# Patient Record
Sex: Male | Born: 1980 | Race: White | Hispanic: No | Smoking: Never smoker
Health system: Southern US, Community
[De-identification: ages and names within clinical notes are randomized; demographics above are authoritative.]

## PROBLEM LIST (undated history)

## (undated) DIAGNOSIS — F319 Bipolar disorder, unspecified: Secondary | ICD-10-CM

## (undated) HISTORY — PX: ADENOIDECTOMY: SUR15

## (undated) HISTORY — PX: CHOLECYSTECTOMY: SHX55

---

## 2015-09-30 ENCOUNTER — Encounter (HOSPITAL_BASED_OUTPATIENT_CLINIC_OR_DEPARTMENT_OTHER): Payer: Self-pay | Admitting: Emergency Medicine

## 2015-09-30 ENCOUNTER — Emergency Department (HOSPITAL_BASED_OUTPATIENT_CLINIC_OR_DEPARTMENT_OTHER): Payer: BLUE CROSS/BLUE SHIELD

## 2015-09-30 ENCOUNTER — Emergency Department (HOSPITAL_BASED_OUTPATIENT_CLINIC_OR_DEPARTMENT_OTHER)
Admission: EM | Admit: 2015-09-30 | Discharge: 2015-09-30 | Disposition: A | Discharge status: Home / Self Care | Payer: BLUE CROSS/BLUE SHIELD | Attending: Emergency Medicine | Admitting: Emergency Medicine

## 2015-09-30 DIAGNOSIS — R1013 Epigastric pain: Secondary | ICD-10-CM | POA: Diagnosis present

## 2015-09-30 DIAGNOSIS — K8020 Calculus of gallbladder without cholecystitis without obstruction: Secondary | ICD-10-CM | POA: Diagnosis not present

## 2015-09-30 LAB — CBC WITH DIFFERENTIAL/PLATELET
BASOS PCT: 0 %
Basophils Absolute: 0 10*3/uL (ref 0.0–0.1)
Eosinophils Absolute: 0.1 10*3/uL (ref 0.0–0.7)
Eosinophils Relative: 1 %
HCT: 43.1 % (ref 39.0–52.0)
HEMOGLOBIN: 15.4 g/dL (ref 13.0–17.0)
LYMPHS ABS: 1.2 10*3/uL (ref 0.7–4.0)
Lymphocytes Relative: 10 %
MCH: 29 pg (ref 26.0–34.0)
MCHC: 35.7 g/dL (ref 30.0–36.0)
MCV: 81.2 fL (ref 78.0–100.0)
MONOS PCT: 6 %
Monocytes Absolute: 0.8 10*3/uL (ref 0.1–1.0)
NEUTROS ABS: 10.7 10*3/uL — AB (ref 1.7–7.7)
NEUTROS PCT: 83 %
Platelets: 225 10*3/uL (ref 150–400)
RBC: 5.31 MIL/uL (ref 4.22–5.81)
RDW: 13.5 % (ref 11.5–15.5)
WBC: 12.8 10*3/uL — ABNORMAL HIGH (ref 4.0–10.5)

## 2015-09-30 LAB — COMPREHENSIVE METABOLIC PANEL
ALBUMIN: 4.5 g/dL (ref 3.5–5.0)
ALK PHOS: 38 U/L (ref 38–126)
ALT: 31 U/L (ref 17–63)
ANION GAP: 5 (ref 5–15)
AST: 27 U/L (ref 15–41)
BILIRUBIN TOTAL: 0.8 mg/dL (ref 0.3–1.2)
BUN: 15 mg/dL (ref 6–20)
CALCIUM: 9.2 mg/dL (ref 8.9–10.3)
CO2: 27 mmol/L (ref 22–32)
Chloride: 104 mmol/L (ref 101–111)
Creatinine, Ser: 1.01 mg/dL (ref 0.61–1.24)
GFR calc Af Amer: >60 mL/min (ref >60)
GFR calc non Af Amer: >60 mL/min (ref >60)
GLUCOSE: 113 mg/dL — AB (ref 65–99)
Potassium: 3.9 mmol/L (ref 3.5–5.1)
Sodium: 136 mmol/L (ref 135–145)
TOTAL PROTEIN: 7.6 g/dL (ref 6.5–8.1)

## 2015-09-30 LAB — LIPASE, BLOOD: Lipase: 19 U/L (ref 11–51)

## 2015-09-30 MED ORDER — PANTOPRAZOLE SODIUM 40 MG PO TBEC
40.0000 mg | DELAYED_RELEASE_TABLET | Freq: Once | ORAL | Status: AC
  Administered 2015-09-30: 40 mg via ORAL
  Filled 2015-09-30: qty 1

## 2015-09-30 MED ORDER — HYDROCODONE-ACETAMINOPHEN 5-325 MG PO TABS: 2.0000 | ORAL_TABLET | ORAL | Status: DC | PRN

## 2015-09-30 MED ORDER — ONDANSETRON HCL 4 MG/2ML IJ SOLN
4.0000 mg | Freq: Once | INTRAMUSCULAR | Status: AC
  Administered 2015-09-30: 4 mg via INTRAVENOUS
  Filled 2015-09-30: qty 2

## 2015-09-30 MED ORDER — ONDANSETRON 4 MG PO TBDP
4.0000 mg | ORAL_TABLET | Freq: Once | ORAL | Status: AC
  Administered 2015-09-30: 4 mg via ORAL
  Filled 2015-09-30: qty 1

## 2015-09-30 MED ORDER — ONDANSETRON 4 MG PO TBDP: 4.0000 mg | ORAL_TABLET | Freq: Three times a day (TID) | ORAL | Status: DC | PRN

## 2015-09-30 MED ORDER — GI COCKTAIL ~~LOC~~
30.0000 mL | Freq: Once | ORAL | Status: AC
  Administered 2015-09-30: 30 mL via ORAL
  Filled 2015-09-30: qty 30

## 2015-09-30 MED ORDER — IOPAMIDOL (ISOVUE-300) INJECTION 61%
100.0000 mL | Freq: Once | INTRAVENOUS | Status: AC | PRN
  Administered 2015-09-30: 100 mL via INTRAVENOUS

## 2015-09-30 MED ORDER — HYDROMORPHONE HCL 1 MG/ML IJ SOLN
1.0000 mg | Freq: Once | INTRAMUSCULAR | Status: AC
  Administered 2015-09-30: 1 mg via INTRAVENOUS
  Filled 2015-09-30: qty 1

## 2015-09-30 NOTE — ED Notes (Signed)
patient went to an urgent care this am for the same and was told to go home to increase his fiber. The patient states that since he has had an increase in the pain and he is now vomiting

## 2015-09-30 NOTE — ED Notes (Signed)
Pt transported to Radiology 

## 2015-09-30 NOTE — ED Provider Notes (Signed)
CSN: [id number]     Arrival date & time 09/30/15  1953 History  By signing my name below, I, Ronald Fritz, attest that this documentation has been prepared under the direction and in the presence of Rolland PorterMark Ceaser Ebeling, MD. Electronically Signed: Randell PatientMarrissa Fritz, ED Scribe. 09/30/2015. 10:39 PM.    Chief Complaint  Patient presents with  . Abdominal Pain   The history is provided by the patient. No language interpreter was used.  HPI Comments: Ronald Fritz is a 35 y.o. male who presents to the Emergency Department complaining of gradually worsening, dull/cramping epigastric abdominal pain that increased in frequency from intermittent to constant onset last night that woke him from sleep. Pt states that he ate a small meal last night but was asymptomatic until 20 hours ago when pain began. He notes that he was seen at an Urgent Care earlier today where he received abdominal imaging positive for a slight blockage, was diagnosed with constipation, and advised to increase his fiber intake. He reports emesis x2, intermittent nausea, loss of appetite upon waking this morning. He has drank fluids and eaten light meals today. His last normal BM was yesterday and he states that he has had a very small BM today. Pain is unchanged by bowel movements, vomiting, and palpation. He has taken Miralax and Metamucil without relief. Denies recent ETOH consumption, high caffeine intake, regular aspirin use. Denies similar symptoms in the past. Denies nausea currently, dysuria, urinary frequency, hematochezia, melena, or any other symptoms.  History reviewed. No pertinent past medical history. History reviewed. No pertinent past surgical history. History reviewed. No pertinent family history. Social History  Substance Use Topics  . Smoking status: Never Smoker   . Smokeless tobacco: None  . Alcohol Use: No    Review of Systems  Constitutional: Positive for appetite change. Negative for fever, chills, diaphoresis  and fatigue.  HENT: Negative for mouth sores, sore throat and trouble swallowing.   Eyes: Negative for visual disturbance.  Respiratory: Negative for cough, chest tightness, shortness of breath and wheezing.   Cardiovascular: Negative for chest pain.  Gastrointestinal: Positive for nausea, vomiting and abdominal pain. Negative for diarrhea, blood in stool and abdominal distention.  Endocrine: Negative for polydipsia, polyphagia and polyuria.  Genitourinary: Negative for dysuria, frequency and hematuria.  Musculoskeletal: Negative for gait problem.  Skin: Negative for color change, pallor and rash.  Neurological: Negative for dizziness, syncope, light-headedness and headaches.  Hematological: Does not bruise/bleed easily.  Psychiatric/Behavioral: Negative for behavioral problems and confusion.   Allergies  Review of patient's allergies indicates no known allergies.  Home Medications   Prior to Admission medications   Medication Sig Start Date End Date Taking? Authorizing Provider  HYDROcodone-acetaminophen (NORCO/VICODIN) 5-325 MG tablet Take 2 tablets by mouth every 4 (four) hours as needed. 09/30/15   Rolland PorterMark Anitria Andon, MD  ondansetron (ZOFRAN ODT) 4 MG disintegrating tablet Take 1 tablet (4 mg total) by mouth every 8 (eight) hours as needed for nausea. 09/30/15   Rolland PorterMark Quantrell Splitt, MD   BP 130/86 mmHg  Pulse 64  Temp(Src) 97.8 F (36.6 C) (Oral)  Resp 18  Ht 6\' 3"  (1.905 m)  Wt 290 lb (131.543 kg)  BMI 36.25 kg/m2  SpO2 97% Physical Exam  Constitutional: He is oriented to person, place, and time. He appears well-developed and well-nourished. No distress.  HENT:  Head: Normocephalic.  Eyes: Conjunctivae are normal. Pupils are equal, round, and reactive to light. No scleral icterus.  Neck: Normal range of motion. Neck supple. No thyromegaly  present.  Cardiovascular: Normal rate and regular rhythm.  Exam reveals no gallop and no friction rub.   No murmur heard. Pulmonary/Chest: Effort normal  and breath sounds normal. No respiratory distress. He has no wheezes. He has no rales.  Abdominal: Soft. He exhibits no distension. Bowel sounds are decreased. There is tenderness in the epigastric area. There is no rebound.  Tenderness over the mid-epigastrium. Diffuse hypoactive bowel sounds.  Musculoskeletal: Normal range of motion.  Neurological: He is alert and oriented to person, place, and time.  Skin: Skin is warm and dry. No rash noted.  Psychiatric: He has a normal mood and affect. His behavior is normal.    ED Course  Procedures   DIAGNOSTIC STUDIES: Oxygen Saturation is 100% on RA, normal by my interpretation.    COORDINATION OF CARE: 8:22 PM Will order GI cocktail, Zofran, Protonix, abdominal x-ray, and labs. Discussed treatment plan with pt at bedside and pt agreed to plan.  9:22 PM Ordered abdominal CT, IV fludis, and iopamidol.  Labs Review Labs Reviewed  CBC WITH DIFFERENTIAL/PLATELET - Abnormal; Notable for the following:    WBC 12.8 (*)    Neutro Abs 10.7 (*)    All other components within normal limits  COMPREHENSIVE METABOLIC PANEL - Abnormal; Notable for the following:    Glucose, Bld 113 (*)    All other components within normal limits  LIPASE, BLOOD    Imaging Review Ct Abdomen Pelvis W Contrast  09/30/2015  CLINICAL DATA:  Gradually worsening epigastric pain, worse last night when that awakened him from sleep. EXAM: CT ABDOMEN AND PELVIS WITH CONTRAST TECHNIQUE: Multidetector CT imaging of the abdomen and pelvis was performed using the standard protocol following bolus administration of intravenous contrast. CONTRAST:  ISOVUE-300 IOPAMIDOL (ISOVUE-300) INJECTION 61% COMPARISON:  Radiographs 09/30/2015 FINDINGS: There are at least 2 calculi within the gallbladder, measuring up to 2.5 cm. The gallbladder wall appears thickened and edematous. No bile duct dilatation. Liver is normal. The spleen, pancreas, adrenals and kidneys are normal. Ureters and  urinary bladder are unremarkable. Bowel is unremarkable. Appendix is normal. The abdominal aorta is normal in caliber. There is no atherosclerotic calcification. There is no adenopathy in the abdomen or pelvis. There is no significant abnormality in the lower chest. There is no significant musculoskeletal abnormality. IMPRESSION: Cholelithiasis with thickened gallbladder wall. This could represent acute cholecystitis. Consider ultrasound for better characterization. No other significant abnormality is evident in the abdomen or pelvis. Lung bases are clear. These results will be called to the ordering clinician or representative by the Radiologist Assistant, and communication documented in the PACS or zVision Dashboard. Electronically Signed   By: Ellery Plunk M.D.   On: 09/30/2015 22:31   Dg Abd Acute W/chest  09/30/2015  CLINICAL DATA:  35 year old male with a history of abdominal pain EXAM: DG ABDOMEN ACUTE W/ 1V CHEST COMPARISON:  None. FINDINGS: Chest: Cardiomediastinal silhouette within normal limits. No evidence of pulmonary vascular congestion. No pneumothorax. No pleural effusion. Coarsened interstitium. No confluent airspace disease. Abdomen: No unexpected calcifications.  No unexpected soft tissue density. No unexpected radiopaque foreign body. Gas within stomach, hepatic flexure, with minimal gas within small bowel loops. No displaced fracture. IMPRESSION: Chest: Nonspecific interstitial opacities bilaterally, potentially chronic, though could represent atypical infection. No evidence of lobar pneumonia. There is concern for further evaluation, a formal PA and lateral chest x-ray may be useful. Abdomen: Nonspecific bowel gas pattern. Signed, Yvone Neu. Loreta Ave, DO [facility] Radiology Electronically  Signed   By: Gilmer Mor D.O.   On: 09/30/2015 21:09   I have personally reviewed and evaluated these images and lab results as part of my medical  decision-making.   MDM   Final diagnoses:  Calculus of gallbladder without cholecystitis without obstruction   Initially, patient was hesitant for any pain medication as he states his wife was home asleep with the kids. I did discuss the CT scan findings with him. This does show some edema of the gallbladder wall and stones. He does not have elevation of papular enzymes. CBC 12.2. His pain. He does not have peritoneal irritation or Murphy sign of his right upper quadrant. I discussed with him that his stones are very likely the cause of his symptoms. We discussed fat-free diet liquids only. Symptom control. Return to ER with any new or worsening symptoms. Surgical referral for Monday morning. Has strong preference was to have sent control tonight and attempt outpatient follow-up. Ultimately states he would call for a ride. He was given some IV Dilaudid and Zofran.   Medical screening examination/treatment/procedure(s) were conducted as a shared visit with non-physician practitioner(s) and myself.  I personally evaluated the patient during the encounter.   EKG Interpretation None         Rolland Porter, MD 09/30/15 2245

## 2015-09-30 NOTE — Discharge Instructions (Signed)
Your CT scan shows gallstones, and some thickening of the gallbladder wall. Big meals and fatty foods will cause worsening pain from biliary colic/gallbladder. Clear liquids, fat-free diet for next 24 hours before slowly advancing your diet. Call [place] surgery on Monday for a follow-up appointment to discuss, or surgery. Return at any point emergency room if you vomiting, fever, or if your symptoms are not controlled with medications.      Cholelithiasis Cholelithiasis (also called gallstones) is a form of gallbladder disease in which gallstones form in your gallbladder. The gallbladder is an organ that stores bile made in the liver, which helps digest fats. Gallstones begin as small crystals and slowly grow into stones. Gallstone pain occurs when the gallbladder spasms and a gallstone is blocking the duct. Pain can also occur when a stone passes out of the duct.  RISK FACTORS  Being male.   Having multiple pregnancies. Health care providers sometimes advise removing diseased gallbladders before future pregnancies.   Being obese.  Eating a diet heavy in fried foods and fat.   Being older than 60 years and increasing age.   Prolonged use of medicines containing male hormones.   Having diabetes mellitus.   Rapidly losing weight.   Having a family history of gallstones (heredity).  SYMPTOMS  Nausea.   Vomiting.  Abdominal pain.   Yellowing of the skin (jaundice).   Sudden pain. It may persist from several minutes to several hours.  Fever.   Tenderness to the touch. In some cases, when gallstones do not move into the bile duct, people have no pain or symptoms. These are called "silent" gallstones.  TREATMENT Silent gallstones do not need treatment. In severe cases, emergency surgery may be required. Options for treatment include:  Surgery to remove the gallbladder. This is the most common treatment.  Medicines. These do not always work and  may take 6-12 months or more to work.  Shock wave treatment (extracorporeal biliary lithotripsy). In this treatment an ultrasound machine sends shock waves to the gallbladder to break gallstones into smaller pieces that can pass into the intestines or be dissolved by medicine. HOME CARE INSTRUCTIONS   Only take over-the-counter or prescription medicines for pain, discomfort, or fever as directed by your health care provider.   Follow a low-fat diet until seen again by your health care provider. Fat causes the gallbladder to contract, which can result in pain.   Follow up with your health care provider as directed. Attacks are almost always recurrent and surgery is usually required for permanent treatment.  SEEK IMMEDIATE MEDICAL CARE IF:   Your pain increases and is not controlled by medicines.   You have a fever or persistent symptoms for more than 2-3 days.   You have a fever and your symptoms suddenly get worse.   You have persistent nausea and vomiting.  MAKE SURE YOU:   Understand these instructions.  Will watch your condition.  Will get help right away if you are not doing well or get worse.   This information is not intended to replace advice given to you by your health care provider. Make sure you discuss any questions you have with your health care provider.   Document Released: 05/09/2005 Document Revised: 01/13/2013 Document Reviewed: 11/04/2012 Elsevier Interactive Patient Education [employer]2016 [employer] Inc.

## 2015-10-02 NOTE — ED Notes (Signed)
Spouse of patient called to say that the patient was seen here two days ago and has gallstones.  Can not get an appointment until 5/15.  States the patient got up from a nap and is pale in color and has chills.  Spouse told pt to take a shower.  Reviewed precautions with spouse for immediate return to the ed.  Fever, dizziness, uncontrolled nausea and vomiting.  Encouraged to call [facility] for guidance if feels the patient is getting worse. Verbalized understanding.

## 2015-10-03 ENCOUNTER — Observation Stay (HOSPITAL_COMMUNITY)
Admission: EM | Admit: 2015-10-03 | Discharge: 2015-10-05 | Disposition: A | Discharge status: Home / Self Care | Payer: BLUE CROSS/BLUE SHIELD | Attending: General Surgery | Admitting: General Surgery

## 2015-10-03 ENCOUNTER — Emergency Department (HOSPITAL_COMMUNITY): Payer: BLUE CROSS/BLUE SHIELD

## 2015-10-03 ENCOUNTER — Encounter (HOSPITAL_COMMUNITY): Payer: Self-pay

## 2015-10-03 DIAGNOSIS — K8000 Calculus of gallbladder with acute cholecystitis without obstruction: Principal | ICD-10-CM | POA: Insufficient documentation

## 2015-10-03 DIAGNOSIS — K810 Acute cholecystitis: Secondary | ICD-10-CM | POA: Diagnosis present

## 2015-10-03 DIAGNOSIS — R1011 Right upper quadrant pain: Secondary | ICD-10-CM

## 2015-10-03 DIAGNOSIS — Z6835 Body mass index (BMI) 35.0-35.9, adult: Secondary | ICD-10-CM | POA: Insufficient documentation

## 2015-10-03 LAB — LIPASE, BLOOD: LIPASE: 22 U/L (ref 11–51)

## 2015-10-03 LAB — COMPREHENSIVE METABOLIC PANEL
ALT: 79 U/L — AB (ref 17–63)
ANION GAP: 11 (ref 5–15)
AST: 79 U/L — ABNORMAL HIGH (ref 15–41)
Albumin: 3.3 g/dL — ABNORMAL LOW (ref 3.5–5.0)
Alkaline Phosphatase: 64 U/L (ref 38–126)
BUN: 10 mg/dL (ref 6–20)
CALCIUM: 8.7 mg/dL — AB (ref 8.9–10.3)
CHLORIDE: 99 mmol/L — AB (ref 101–111)
CO2: 26 mmol/L (ref 22–32)
CREATININE: 1 mg/dL (ref 0.61–1.24)
Glucose, Bld: 99 mg/dL (ref 65–99)
Potassium: 3.7 mmol/L (ref 3.5–5.1)
SODIUM: 136 mmol/L (ref 135–145)
Total Bilirubin: 1.2 mg/dL (ref 0.3–1.2)
Total Protein: 7.1 g/dL (ref 6.5–8.1)

## 2015-10-03 LAB — CBC WITH DIFFERENTIAL/PLATELET
Basophils Absolute: 0 10*3/uL (ref 0.0–0.1)
Basophils Relative: 0 %
EOS ABS: 0.2 10*3/uL (ref 0.0–0.7)
EOS PCT: 2 %
HCT: 40.1 % (ref 39.0–52.0)
Hemoglobin: 13.4 g/dL (ref 13.0–17.0)
LYMPHS ABS: 1.3 10*3/uL (ref 0.7–4.0)
LYMPHS PCT: 18 %
MCH: 28 pg (ref 26.0–34.0)
MCHC: 33.4 g/dL (ref 30.0–36.0)
MCV: 83.9 fL (ref 78.0–100.0)
MONO ABS: 0.6 10*3/uL (ref 0.1–1.0)
Monocytes Relative: 8 %
Neutro Abs: 5.4 10*3/uL (ref 1.7–7.7)
Neutrophils Relative %: 72 %
PLATELETS: 206 10*3/uL (ref 150–400)
RBC: 4.78 MIL/uL (ref 4.22–5.81)
RDW: 13.2 % (ref 11.5–15.5)
WBC: 7.4 10*3/uL (ref 4.0–10.5)

## 2015-10-03 MED ORDER — ONDANSETRON HCL 4 MG/2ML IJ SOLN
4.0000 mg | Freq: Four times a day (QID) | INTRAMUSCULAR | Status: DC | PRN
  Administered 2015-10-04 (×2): 4 mg via INTRAVENOUS
  Filled 2015-10-03 (×2): qty 2

## 2015-10-03 MED ORDER — ONDANSETRON 4 MG PO TBDP: 4.0000 mg | ORAL_TABLET | Freq: Four times a day (QID) | ORAL | Status: DC | PRN

## 2015-10-03 MED ORDER — ENOXAPARIN SODIUM 80 MG/0.8ML ~~LOC~~ SOLN
70.0000 mg | Freq: Every day | SUBCUTANEOUS | Status: DC
  Administered 2015-10-04: 70 mg via SUBCUTANEOUS
  Filled 2015-10-03: qty 0.8

## 2015-10-03 MED ORDER — ONDANSETRON HCL 4 MG/2ML IJ SOLN
4.0000 mg | Freq: Once | INTRAMUSCULAR | Status: AC
  Administered 2015-10-03: 4 mg via INTRAVENOUS
  Filled 2015-10-03: qty 2

## 2015-10-03 MED ORDER — ACETAMINOPHEN 650 MG RE SUPP: 650.0000 mg | Freq: Four times a day (QID) | RECTAL | Status: DC | PRN

## 2015-10-03 MED ORDER — HYDROMORPHONE HCL 1 MG/ML IJ SOLN
1.0000 mg | Freq: Once | INTRAMUSCULAR | Status: AC
  Administered 2015-10-03: 1 mg via INTRAVENOUS
  Filled 2015-10-03: qty 1

## 2015-10-03 MED ORDER — DEXTROSE 5 % IV SOLN
2.0000 g | INTRAVENOUS | Status: DC
  Administered 2015-10-03 – 2015-10-04 (×2): 2 g via INTRAVENOUS
  Filled 2015-10-03 (×3): qty 2

## 2015-10-03 MED ORDER — ACETAMINOPHEN 325 MG PO TABS: 650.0000 mg | ORAL_TABLET | Freq: Four times a day (QID) | ORAL | Status: DC | PRN

## 2015-10-03 MED ORDER — LACTATED RINGERS IV SOLN
INTRAVENOUS | Status: DC
  Administered 2015-10-03: 125 mL/h via INTRAVENOUS
  Administered 2015-10-04 – 2015-10-05 (×3): via INTRAVENOUS

## 2015-10-03 MED ORDER — HYDROMORPHONE HCL 1 MG/ML IJ SOLN
1.0000 mg | INTRAMUSCULAR | Status: DC | PRN
  Administered 2015-10-03 – 2015-10-05 (×9): 1 mg via INTRAVENOUS
  Filled 2015-10-03 (×10): qty 1

## 2015-10-03 NOTE — ED Provider Notes (Signed)
CSN: [id number]     Arrival date & time 10/03/15  1056 History   First MD Initiated Contact with Patient 10/03/15 1216     Chief Complaint  Patient presents with  . Abdominal Pain     (Consider location/radiation/quality/duration/timing/severity/associated sxs/prior Treatment) HPI Comments: Patient presents with 4 days of right upper quadrant abdominal pain that began in the epigastrium and has moved to the right upper quadrant. Pain does not radiate to the back or shoulder. Patient was seen at [facility] on 09/30/2015 and had a CT scan which demonstrated a thickened gallbladder wall with gallstones. Patient's pain was controlled and he was discharged home with Vicodin and surgery follow-up. Patient has been receiving some pain relief with Vicodin but the pain has been constant since that time. He has not developed any fevers, vomiting, or diarrhea. Patient has surgery follow-up on 5/15. Yesterday patient had an episode where he was pale with shivering chills. He took a shower which helped his symptoms. Onset of symptoms acute. Course is persistent. Nothing makes symptoms worse.  The history is provided by the patient and medical records.    History reviewed. No pertinent past medical history. History reviewed. No pertinent past surgical history. History reviewed. No pertinent family history. Social History  Substance Use Topics  . Smoking status: Never Smoker   . Smokeless tobacco: None  . Alcohol Use: No    Review of Systems  Constitutional: Negative for fever.  HENT: Negative for rhinorrhea and sore throat.   Eyes: Negative for redness.  Respiratory: Negative for cough.   Cardiovascular: Negative for chest pain.  Gastrointestinal: Positive for abdominal pain. Negative for nausea, vomiting and diarrhea.  Genitourinary: Negative for dysuria.  Musculoskeletal: Negative for myalgias.  Skin: Negative for rash.  Neurological: Negative for headaches.    Allergies  Review of patient's  allergies indicates no known allergies.  Home Medications   Prior to Admission medications   Medication Sig Start Date End Date Taking? Authorizing Provider  HYDROcodone-acetaminophen (NORCO/VICODIN) 5-325 MG tablet Take 2 tablets by mouth every 4 (four) hours as needed. 09/30/15   Rolland Porter, MD  ondansetron (ZOFRAN ODT) 4 MG disintegrating tablet Take 1 tablet (4 mg total) by mouth every 8 (eight) hours as needed for nausea. 09/30/15   Rolland Porter, MD   BP 126/76 mmHg  Pulse 86  Temp(Src) 98.7 F (37.1 C) (Oral)  Resp 18  Ht  (1.905 m)  Wt 129.275 kg  BMI 35.62 kg/m2  SpO2 97%   Physical Exam  Constitutional: He appears well-developed and well-nourished.  HENT:  Head: Normocephalic and atraumatic.  Eyes: Conjunctivae are normal. Right eye exhibits no discharge. Left eye exhibits no discharge.  Neck: Normal range of motion. Neck supple.  Cardiovascular: Normal rate, regular rhythm and normal heart sounds.   Pulmonary/Chest: Effort normal and breath sounds normal.  Abdominal: Soft. He exhibits no distension. Tenderness: moderate RUQ, neg Murphy's. There is no rebound and no guarding.  Neurological: He is alert.  Skin: Skin is warm and dry.  Psychiatric: He has a normal mood and affect.  Nursing note and vitals reviewed.   ED Course  Procedures (including critical care time) Labs Review Labs Reviewed  COMPREHENSIVE METABOLIC PANEL - Abnormal; Notable for the following:    Chloride 99 (*)    Calcium 8.7 (*)    Albumin 3.3 (*)    AST 79 (*)    ALT 79 (*)    All other components within normal limits  CBC WITH DIFFERENTIAL/PLATELET  LIPASE, BLOOD    Imaging Review [place] Abdomen Limited Ruq  10/03/2015  CLINICAL DATA:  Right upper abdominal pain. Cholelithiasis on recent CT EXAM: US ABDOMEN LIMITED - RIGHT UPPER QUADRANT COMPARISON:  CT 09/30/2015 FINDINGS: Gallbladder: Physiologically distended. Calculi measured up to 22 mm diameter. Wall thickening up to 2.8 mm, edematous.  Trace pericholecystic fluid. Common bile duct: Diameter: 5.4 mm Liver: No focal lesion identified. Within normal limits in parenchymal echogenicity. Trace perihepatic ascites. IMPRESSION: 1. Cholelithiasis with gallbladder wall thickening suggesting acute cholecystitis. If the clinical picture is inconclusive, hepatobiliary scintigraphy may be useful. Electronically Signed   By: Corlis Leak  Hassell M.D.   On: 10/03/2015 14:27   I have personally reviewed and evaluated these images and lab results as part of my medical decision-making.   12:37 PM Patient seen and examined. Work-up initiated. Medications ordered. Will order US to eval GB.   Vital signs reviewed and are as follows: BP 126/76 mmHg  Pulse 86  Temp(Src) 98.7 F (37.1 C) (Oral)  Resp 18  Ht 6\' 3"  (1.905 m)  Wt 129.275 kg  BMI 35.62 kg/m2  SpO2 97%  2:40 PM Pt's pain is controlled. US is suggestive of cholecystitis. Last oral intake was 9am (juice). Will consult surgery. Dr. Clayborne DanaMesner to see. Patient updated on results and plan.   2:54 PM Spoke with general surgery PA who will see patient. Anticipate admission. Pt agreeable.   MDM   Final diagnoses:  RUQ pain  Acute cholecystitis   Admit.    Renne CriglerJoshua Miriana Gaertner, PA-C 10/03/15 1455  Marily MemosJason Mesner, MD 10/04/15 1027

## 2015-10-03 NOTE — Progress Notes (Signed)
New Admission Note:   Arrival: From ED via wheelchair with EMT Mental Orientation: A&Ox4 Telemetry: None ordered Assessment:  See doc flowsheet Skin: Intact IV: Left AC IV, infusing Pain: RUQ pain, 4 out of 10 pain currently.  Patient did not want pain medication at this time. Safety Measures:  Call bell placed within reach; patient instructed on use of call bell and verbalized understanding. Bed in lowest position.   [address] Orientation: Patient oriented to staff, room, and unit. Family: None at bedside.   Orders have been reviewed and implemented. Consent signed and in chart.  Admit questions completed.  Patient educated on surgical protocol (i.e. Surgical PCR, CHG bath) and to call if he needs pain medication.  RN also informed patient that he is scheduled for OR at 0815.  Will continue to monitor.    Rozann Leschesebecca Nelia Rogoff, RN3, RN-BC, BSN

## 2015-10-03 NOTE — H&P (Signed)
Chief Complaint: abdominal pain HPI: Ronald Fritz is a healthy 35 year old male who presents with ongoing abdominal pain.  Started initially middle of the night on Friday night.  He was seen at an urgent care on Saturday and diagnosed with constipation.  He was seen at [facility] on Saturday night at which time LFTs wre normal, WBC was 12.8k, CT a/p with thickened gallbladder and cholelithiasis.  He was sent home with Norco, clear liquid diet and to follow up in our office which was scheduled for the 15th.  He ran out of norco this morning and had worsening RUQ abdominal pain.  Endorses to vomiting on Saturday, but none since.  Symptoms are exacerbated by hiccoughs, turning.  Somewhat alleviated by norco.  Time pattern is constant.  Severe in severity.  Reports chills.  Denies fevers, melena or hematochezia.  Abdominal US suggests wall thickening and stones.  LFTs and WBC are normal.  We have been asked to evaluate for ongoing pain.   History reviewed. No pertinent past medical history.  History reviewed. No pertinent past surgical history.  History reviewed. No pertinent family history. Social History:  reports that he has never smoked. He does not have any smokeless tobacco history on file. He reports that he does not drink alcohol or use illicit drugs.  Allergies: No Known Allergies   (Not in a hospital admission)  Results for orders placed or performed during the hospital encounter of 10/03/15 (from the past 48 hour(s))  CBC with Differential/Platelet     Status: None   Collection Time: 10/03/15 12:32 PM  Result Value Ref Range   WBC 7.4 4.0 - 10.5 K/uL   RBC 4.78 4.22 - 5.81 MIL/uL   Hemoglobin 13.4 13.0 - 17.0 g/dL   HCT 40.1 39.0 - 52.0 %   MCV 83.9 78.0 - 100.0 fL   MCH 28.0 26.0 - 34.0 pg   MCHC 33.4 30.0 - 36.0 g/dL   RDW 13.2 11.5 - 15.5 %   Platelets 206 150 - 400 K/uL   Neutrophils Relative % 72 %   Neutro Abs 5.4 1.7 - 7.7 K/uL   Lymphocytes Relative 18 %   Lymphs Abs 1.3  0.7 - 4.0 K/uL   Monocytes Relative 8 %   Monocytes Absolute 0.6 0.1 - 1.0 K/uL   Eosinophils Relative 2 %   Eosinophils Absolute 0.2 0.0 - 0.7 K/uL   Basophils Relative 0 %   Basophils Absolute 0.0 0.0 - 0.1 K/uL  Comprehensive metabolic panel     Status: Abnormal   Collection Time: 10/03/15 12:32 PM  Result Value Ref Range   Sodium 136 135 - 145 mmol/L   Potassium 3.7 3.5 - 5.1 mmol/L   Chloride 99 (L) 101 - 111 mmol/L   CO2 26 22 - 32 mmol/L   Glucose, Bld 99 65 - 99 mg/dL   BUN 10 6 - 20 mg/dL   Creatinine, Ser 1.00 0.61 - 1.24 mg/dL   Calcium 8.7 (L) 8.9 - 10.3 mg/dL   Total Protein 7.1 6.5 - 8.1 g/dL   Albumin 3.3 (L) 3.5 - 5.0 g/dL   AST 79 (H) 15 - 41 U/L   ALT 79 (H) 17 - 63 U/L   Alkaline Phosphatase 64 38 - 126 U/L   Total Bilirubin 1.2 0.3 - 1.2 mg/dL   GFR calc non Af Amer >60 >60 mL/min   GFR calc Af Amer >60 >60 mL/min    Comment: (NOTE) The eGFR has been calculated using the  CKD EPI equation. This calculation has not been validated in all clinical situations. eGFR's persistently <60 mL/min signify possible Chronic Kidney Disease.    Anion gap 11 5 - 15  Lipase, blood     Status: None   Collection Time: 10/03/15 12:32 PM  Result Value Ref Range   Lipase 22 11 - 51 U/L   US Abdomen Limited Ruq  10/03/2015  CLINICAL DATA:  Right upper abdominal pain. Cholelithiasis on recent CT EXAM: US ABDOMEN LIMITED - RIGHT UPPER QUADRANT COMPARISON:  CT 09/30/2015 FINDINGS: Gallbladder: Physiologically distended. Calculi measured up to 22 mm diameter. Wall thickening up to 2.8 mm, edematous. Trace pericholecystic fluid. Common bile duct: Diameter: 5.4 mm Liver: No focal lesion identified. Within normal limits in parenchymal echogenicity. Trace perihepatic ascites. IMPRESSION: 1. Cholelithiasis with gallbladder wall thickening suggesting acute cholecystitis. If the clinical picture is inconclusive, hepatobiliary scintigraphy may be useful. Electronically Signed   By: Lucrezia Europe  M.D.   On: 10/03/2015 14:27    Review of Systems  Constitutional: Positive for chills. Negative for fever, weight loss, malaise/fatigue and diaphoresis.  Eyes: Negative for blurred vision, double vision, photophobia, pain, discharge and redness.  Respiratory: Negative for cough, hemoptysis, sputum production, shortness of breath and wheezing.   Cardiovascular: Negative for chest pain, palpitations, orthopnea, claudication, leg swelling and PND.  Gastrointestinal: Positive for abdominal pain. Negative for heartburn, nausea, vomiting, diarrhea, constipation, blood in stool and melena.  Genitourinary: Negative for dysuria, urgency, frequency, hematuria and flank pain.  Musculoskeletal: Negative for myalgias, back pain, joint pain, falls and neck pain.  Neurological: Negative for dizziness, tingling, tremors, sensory change, speech change, focal weakness, seizures, loss of consciousness and weakness.  Psychiatric/Behavioral: Negative for depression, suicidal ideas, hallucinations, memory loss and substance abuse. The patient is not nervous/anxious and does not have insomnia.     Blood pressure 128/80, pulse 92, temperature 98.7 F (37.1 C), temperature source Oral, resp. rate 18, height _0  (1.905 m), weight 129.275 kg (285 lb), SpO2 97 %. Physical Exam  Constitutional: He is oriented to person, place, and time. He appears well-developed and well-nourished. No distress.  Cardiovascular: Normal rate, regular rhythm, normal heart sounds and intact distal pulses.  Exam reveals no gallop and no friction rub.   No murmur heard. Respiratory: Effort normal and breath sounds normal. No respiratory distress. He has no wheezes. He has no rales. He exhibits no tenderness.  GI: Soft. Bowel sounds are normal. He exhibits no distension and no mass. There is no rebound and no guarding.  +murphy's sign  Musculoskeletal: Normal range of motion. He exhibits no edema or tenderness.  Neurological: He is alert and  oriented to person, place, and time.  Skin: Skin is warm and dry. No rash noted. He is not diaphoretic. No erythema. No pallor.  Psychiatric: He has a normal mood and affect. His behavior is normal. Judgment and thought content normal.     Assessment/Plan Acute calculous cholecystitis-admit for IV antibiotics, lap chole in AM.  Surgical risks discussed including infection, bleeding, injury to surrounding structures, open surgery, need for further surgery, heart attack, DVT/PE, respiratory compromise and death.  He verbalizes understanding and wishes to proceed. ID-rocephin FEN-clears, NPO p MN, IVF, pain control VTE prophylaxis-SCD/lovenox Dispo-admit to floor   Matraca Hunkins, NP 10/03/2015, 3:22 PM

## 2015-10-03 NOTE — ED Notes (Signed)
Pt presents with continued abdominal pain.  [facility], diagnosed with gallstones, reports vicodin is not working.  Pt has surgical consult on 5/15.

## 2015-10-03 NOTE — ED Notes (Signed)
Clear liquid diet ordered.

## 2015-10-03 NOTE — ED Notes (Signed)
US called sts will be approximately 30 min. Primary RN updated.

## 2015-10-04 ENCOUNTER — Observation Stay (HOSPITAL_COMMUNITY): Payer: BLUE CROSS/BLUE SHIELD | Admitting: Critical Care Medicine

## 2015-10-04 ENCOUNTER — Encounter (HOSPITAL_COMMUNITY)
Admission: EM | Disposition: A | Discharge status: Home / Self Care | Payer: Self-pay | Source: Home / Self Care | Attending: Emergency Medicine

## 2015-10-04 ENCOUNTER — Encounter (HOSPITAL_COMMUNITY): Payer: Self-pay | Admitting: Critical Care Medicine

## 2015-10-04 HISTORY — PX: CHOLECYSTECTOMY: SHX55

## 2015-10-04 LAB — COMPREHENSIVE METABOLIC PANEL
ALT: 86 U/L — ABNORMAL HIGH (ref 17–63)
ANION GAP: 9 (ref 5–15)
AST: 55 U/L — ABNORMAL HIGH (ref 15–41)
Albumin: 2.8 g/dL — ABNORMAL LOW (ref 3.5–5.0)
Alkaline Phosphatase: 79 U/L (ref 38–126)
BUN: 9 mg/dL (ref 6–20)
CHLORIDE: 100 mmol/L — AB (ref 101–111)
CO2: 28 mmol/L (ref 22–32)
Calcium: 8.6 mg/dL — ABNORMAL LOW (ref 8.9–10.3)
Creatinine, Ser: 0.96 mg/dL (ref 0.61–1.24)
Glucose, Bld: 92 mg/dL (ref 65–99)
POTASSIUM: 3.9 mmol/L (ref 3.5–5.1)
SODIUM: 137 mmol/L (ref 135–145)
Total Bilirubin: 0.9 mg/dL (ref 0.3–1.2)
Total Protein: 5.8 g/dL — ABNORMAL LOW (ref 6.5–8.1)

## 2015-10-04 LAB — SURGICAL PCR SCREEN
MRSA, PCR: NEGATIVE
STAPHYLOCOCCUS AUREUS: NEGATIVE

## 2015-10-04 SURGERY — LAPAROSCOPIC CHOLECYSTECTOMY
Anesthesia: General | Site: Abdomen

## 2015-10-04 MED ORDER — FENTANYL CITRATE (PF) 250 MCG/5ML IJ SOLN
INTRAMUSCULAR | Status: AC
  Filled 2015-10-04: qty 5

## 2015-10-04 MED ORDER — GLYCOPYRROLATE 0.2 MG/ML IJ SOLN
INTRAMUSCULAR | Status: DC | PRN
  Administered 2015-10-04: .8 mg via INTRAVENOUS

## 2015-10-04 MED ORDER — LIDOCAINE HCL (CARDIAC) 20 MG/ML IV SOLN
INTRAVENOUS | Status: DC | PRN
  Administered 2015-10-04: 70 mg via INTRAVENOUS

## 2015-10-04 MED ORDER — HYDROMORPHONE HCL 1 MG/ML IJ SOLN
INTRAMUSCULAR | Status: AC
  Administered 2015-10-04: 1 mg via INTRAVENOUS
  Filled 2015-10-04: qty 1

## 2015-10-04 MED ORDER — FENTANYL CITRATE (PF) 100 MCG/2ML IJ SOLN
INTRAMUSCULAR | Status: DC | PRN
  Administered 2015-10-04 (×3): 50 ug via INTRAVENOUS
  Administered 2015-10-04: 150 ug via INTRAVENOUS

## 2015-10-04 MED ORDER — PROPOFOL 10 MG/ML IV BOLUS
INTRAVENOUS | Status: AC
  Filled 2015-10-04: qty 20

## 2015-10-04 MED ORDER — BUPIVACAINE HCL 0.25 % IJ SOLN
INTRAMUSCULAR | Status: DC | PRN
  Administered 2015-10-04: 5 mL

## 2015-10-04 MED ORDER — LACTATED RINGERS IV SOLN
INTRAVENOUS | Status: DC | PRN
  Administered 2015-10-04 (×2): via INTRAVENOUS

## 2015-10-04 MED ORDER — ONDANSETRON HCL 4 MG/2ML IJ SOLN
INTRAMUSCULAR | Status: AC
  Filled 2015-10-04: qty 2

## 2015-10-04 MED ORDER — ONDANSETRON HCL 4 MG/2ML IJ SOLN
INTRAMUSCULAR | Status: DC | PRN
  Administered 2015-10-04: 4 mg via INTRAVENOUS

## 2015-10-04 MED ORDER — MIDAZOLAM HCL 2 MG/2ML IJ SOLN
INTRAMUSCULAR | Status: AC
  Filled 2015-10-04: qty 2

## 2015-10-04 MED ORDER — BUPIVACAINE HCL (PF) 0.25 % IJ SOLN
INTRAMUSCULAR | Status: AC
  Filled 2015-10-04: qty 30

## 2015-10-04 MED ORDER — DEXAMETHASONE SODIUM PHOSPHATE 10 MG/ML IJ SOLN
INTRAMUSCULAR | Status: DC | PRN
  Administered 2015-10-04: 10 mg via INTRAVENOUS

## 2015-10-04 MED ORDER — HYDROMORPHONE HCL 1 MG/ML IJ SOLN
0.2500 mg | INTRAMUSCULAR | Status: DC | PRN
Start: 2015-10-04 — End: 2015-10-04
  Administered 2015-10-04: 0.5 mg via INTRAVENOUS

## 2015-10-04 MED ORDER — HEMOSTATIC AGENTS (NO CHARGE) OPTIME
TOPICAL | Status: DC | PRN
  Administered 2015-10-04: 1 via TOPICAL

## 2015-10-04 MED ORDER — ROCURONIUM BROMIDE 100 MG/10ML IV SOLN
INTRAVENOUS | Status: DC | PRN
  Administered 2015-10-04: 30 mg via INTRAVENOUS
  Administered 2015-10-04 (×4): 10 mg via INTRAVENOUS

## 2015-10-04 MED ORDER — MIDAZOLAM HCL 2 MG/2ML IJ SOLN: 0.5000 mg | Freq: Once | INTRAMUSCULAR | Status: DC | PRN

## 2015-10-04 MED ORDER — PROPOFOL 10 MG/ML IV BOLUS
INTRAVENOUS | Status: DC | PRN
  Administered 2015-10-04: 40 mg via INTRAVENOUS
  Administered 2015-10-04: 160 mg via INTRAVENOUS

## 2015-10-04 MED ORDER — SUCCINYLCHOLINE CHLORIDE 20 MG/ML IJ SOLN
INTRAMUSCULAR | Status: DC | PRN
  Administered 2015-10-04: 70 mg via INTRAVENOUS

## 2015-10-04 MED ORDER — MEPERIDINE HCL 25 MG/ML IJ SOLN: 6.2500 mg | INTRAMUSCULAR | Status: DC | PRN

## 2015-10-04 MED ORDER — DEXAMETHASONE SODIUM PHOSPHATE 10 MG/ML IJ SOLN
INTRAMUSCULAR | Status: AC
  Filled 2015-10-04: qty 1

## 2015-10-04 MED ORDER — SODIUM CHLORIDE 0.9 % IR SOLN
Status: DC | PRN
  Administered 2015-10-04 (×3): 1

## 2015-10-04 MED ORDER — MIDAZOLAM HCL 5 MG/5ML IJ SOLN
INTRAMUSCULAR | Status: DC | PRN
  Administered 2015-10-04: 2 mg via INTRAVENOUS

## 2015-10-04 MED ORDER — OXYCODONE-ACETAMINOPHEN 5-325 MG PO TABS
1.0000 | ORAL_TABLET | ORAL | Status: DC | PRN
  Administered 2015-10-05 (×2): 2 via ORAL
  Filled 2015-10-04 (×2): qty 2

## 2015-10-04 MED ORDER — NEOSTIGMINE METHYLSULFATE 10 MG/10ML IV SOLN
INTRAVENOUS | Status: DC | PRN
  Administered 2015-10-04: 5 mg via INTRAVENOUS

## 2015-10-04 MED ORDER — 0.9 % SODIUM CHLORIDE (POUR BTL) OPTIME
TOPICAL | Status: DC | PRN
  Administered 2015-10-04: 1000 mL

## 2015-10-04 SURGICAL SUPPLY — 41 items
BENZOIN TINCTURE PRP APPL 2/3 (GAUZE/BANDAGES/DRESSINGS) ×3 IMPLANT
CANISTER SUCTION 2500CC (MISCELLANEOUS) ×3 IMPLANT
CHLORAPREP W/TINT 26ML (MISCELLANEOUS) ×3 IMPLANT
CLIP LIGATING HEMO O LOK GREEN (MISCELLANEOUS) ×3 IMPLANT
CLOSURE WOUND 1/2 X4 (GAUZE/BANDAGES/DRESSINGS) ×1
COVER SURGICAL LIGHT HANDLE (MISCELLANEOUS) ×3 IMPLANT
COVER TRANSDUCER ULTRASND (DRAPES) IMPLANT
DEVICE TROCAR PUNCTURE CLOSURE (ENDOMECHANICALS) ×3 IMPLANT
ELECT REM PT RETURN 9FT ADLT (ELECTROSURGICAL) ×3
ELECTRODE REM PT RTRN 9FT ADLT (ELECTROSURGICAL) ×1 IMPLANT
GAUZE SPONGE 2X2 8PLY STRL LF (GAUZE/BANDAGES/DRESSINGS) ×1 IMPLANT
GLOVE BIO SURGEON STRL SZ 6.5 (GLOVE) ×2 IMPLANT
GLOVE BIO SURGEON STRL SZ7.5 (GLOVE) ×6 IMPLANT
GLOVE BIO SURGEONS STRL SZ 6.5 (GLOVE) ×1
GLOVE BIOGEL PI IND STRL 7.0 (GLOVE) ×1 IMPLANT
GLOVE BIOGEL PI INDICATOR 7.0 (GLOVE) ×2
GOWN STRL REUS W/ TWL LRG LVL3 (GOWN DISPOSABLE) ×2 IMPLANT
GOWN STRL REUS W/ TWL XL LVL3 (GOWN DISPOSABLE) ×1 IMPLANT
GOWN STRL REUS W/TWL LRG LVL3 (GOWN DISPOSABLE) ×4
GOWN STRL REUS W/TWL XL LVL3 (GOWN DISPOSABLE) ×2
HEMOSTAT SNOW SURGICEL 2X4 (HEMOSTASIS) ×3 IMPLANT
KIT BASIN OR (CUSTOM PROCEDURE TRAY) ×3 IMPLANT
KIT ROOM TURNOVER OR (KITS) ×3 IMPLANT
NEEDLE INSUFFLATION 14GA 120MM (NEEDLE) ×3 IMPLANT
NS IRRIG 1000ML POUR BTL (IV SOLUTION) ×3 IMPLANT
PAD ARMBOARD 7.5X6 YLW CONV (MISCELLANEOUS) ×6 IMPLANT
POUCH RETRIEVAL ECOSAC 10 (ENDOMECHANICALS) IMPLANT
POUCH RETRIEVAL ECOSAC 10MM (ENDOMECHANICALS)
SCISSORS LAP 5X35 DISP (ENDOMECHANICALS) ×3 IMPLANT
SET IRRIG TUBING LAPAROSCOPIC (IRRIGATION / IRRIGATOR) ×3 IMPLANT
SLEEVE ENDOPATH XCEL 5M (ENDOMECHANICALS) ×6 IMPLANT
SPECIMEN JAR SMALL (MISCELLANEOUS) ×3 IMPLANT
SPONGE GAUZE 2X2 STER 10/PKG (GAUZE/BANDAGES/DRESSINGS) ×2
STRIP CLOSURE SKIN 1/2X4 (GAUZE/BANDAGES/DRESSINGS) ×2 IMPLANT
SUT MNCRL AB 3-0 PS2 18 (SUTURE) ×3 IMPLANT
TOWEL OR 17X24 6PK STRL BLUE (TOWEL DISPOSABLE) ×3 IMPLANT
TOWEL OR 17X26 10 PK STRL BLUE (TOWEL DISPOSABLE) ×3 IMPLANT
TRAY LAPAROSCOPIC MC (CUSTOM PROCEDURE TRAY) ×3 IMPLANT
TROCAR XCEL NON-BLD 11X100MML (ENDOMECHANICALS) ×3 IMPLANT
TROCAR XCEL NON-BLD 5MMX100MML (ENDOMECHANICALS) ×3 IMPLANT
TUBING INSUFFLATION (TUBING) ×3 IMPLANT

## 2015-10-04 NOTE — Anesthesia Procedure Notes (Signed)
Procedure Name: Intubation Date/Time: 10/04/2015 8:45 AM Performed by: Glo HerringLEE, Noriah Osgood B Pre-anesthesia Checklist: Patient identified, Emergency Drugs available, Timeout performed, Suction available and Patient being monitored Patient Re-evaluated:Patient Re-evaluated prior to inductionOxygen Delivery Method: Circle system utilized Preoxygenation: Pre-oxygenation with 100% oxygen Intubation Type: IV induction, Rapid sequence and Cricoid Pressure applied Laryngoscope Size: Mac and 4 Grade View: Grade I Tube type: Oral Tube size: 7.5 mm Number of attempts: 1 Airway Equipment and Method: Stylet Placement Confirmation: CO2 detector,  positive ETCO2,  breath sounds checked- equal and bilateral and ETT inserted through vocal cords under direct vision Secured at: 23 cm Tube secured with: Tape Dental Injury: Teeth and Oropharynx as per pre-operative assessment

## 2015-10-04 NOTE — Op Note (Signed)
10/04/2015  10:51 AM  PATIENT:  Ronald Fritz  35 y.o. male  PRE-OPERATIVE DIAGNOSIS:  ACUTE CALCULUS CHOLECYSTITIS  POST-OPERATIVE DIAGNOSIS:  ACUTE CALCULUS CHOLECYSTITIS  PROCEDURE:  Procedure(s): LAPAROSCOPIC CHOLECYSTECTOMY (N/A)  SURGEON:  Surgeon(s) and Role:    * Axel FillerArmando Chaniece Barbato, MD - Primary  ASSISTANTS: Dr. Madelin RearJosh Rickey   ANESTHESIA:   local and general  EBL:  Total I/O In: 1200 [I.V.:1200] Out: 150 [Blood:150]  BLOOD ADMINISTERED:none  DRAINS: none   LOCAL MEDICATIONS USED:  BUPIVICAINE   SPECIMEN:  Source of Specimen:  gallbladder  DISPOSITION OF SPECIMEN:  PATHOLOGY  COUNTS:  YES  TOURNIQUET:  * No tourniquets in log *  DICTATION: .Dragon Dictation The patient was taken to the operating and placed in the supine position with bilateral SCDs in place. The patient was prepped and draped in the usual sterile fashion. A time out was called and all facts were verified. A pneumoperitoneum was obtained via A Veress needle technique to a pressure of 14mm of mercury.  A 5mm trochar was then placed in the right upper quadrant under visualization, and there were no injuries to any abdominal organs. A 11 mm port was then placed in the umbilical region after infiltrating with local anesthesia under direct visualization. A second and third epigastric port and right lower quadrant port placement under direct visualization, respectively.   The gallbladder was identified and retracted, the peritoneum was then sharply dissected from the gallbladder and this dissection was carried down to Calot's triangle. There were dense adhesions at Calot's triangle. The gallbladder was identified and stripped away circumferentially and seen going into the gallbladder 360, the critical angle was obtained.  2 clips were placed proximally one distally and the cystic duct transected. The cystic artery's anterior and posterior branches were identified individually and 2 clips placed proximally and  one distally and transected. We then proceeded to remove the gallbladder off the hepatic fossa with Bovie cautery. A retrieval bag was then placed in the abdomen and gallbladder placed in the bag. The hepatic fossa was then reexamined and hemostasis was achieved and Surgicel snow was placed in the gallbladder fossa. The subhepatic fossa and perihepatic fossa was then irrigated until the effluent was clear.  The gallbladder and bag were removed from the abdominal cavity. The 11 mm trocar fascia was reapproximated with the Endo Close #1 Vicryl x4. The pneumoperitoneum was evacuated and all trochars removed under direct visulalization. The skin was then closed with 4-0 Monocryl and the skin dressed with Steri-Strips, gauze, and tape. The patient was awaken from general anesthesia and taken to the recovery room in stable condition.   PLAN OF CARE: Admit to inpatient   PATIENT DISPOSITION:  PACU - hemodynamically stable.   Delay start of Pharmacological VTE agent (>24hrs) due to surgical blood loss or risk of bleeding: not applicable

## 2015-10-04 NOTE — Transfer of Care (Signed)
Immediate Anesthesia Transfer of Care Note  Patient: Ria BushDavid Surman  Procedure(s) Performed: Procedure(s): LAPAROSCOPIC CHOLECYSTECTOMY (N/A)  Patient Location: PACU  Anesthesia Type:General  Level of Consciousness: awake, alert  and oriented  Airway & Oxygen Therapy: Patient Spontanous Breathing and Patient connected to nasal cannula oxygen  Post-op Assessment: Report given to RN, Post -op Vital signs reviewed and stable and Patient moving all extremities X 4  Post vital signs: Reviewed and stable  Last Vitals:  Filed Vitals:   10/04/15 0000 10/04/15 0405  BP: 120/78 123/80  Pulse: 59 73  Temp: 37.3 C 36.9 C  Resp: 22 20    Last Pain:  Filed Vitals:   10/04/15 0649  PainSc: 2       Patients Stated Pain Goal: 0 (10/04/15 0534)  Complications: No apparent anesthesia complications

## 2015-10-04 NOTE — Anesthesia Postprocedure Evaluation (Signed)
Anesthesia Post Note  Patient: Ronald BushDavid Fritz  Procedure(s) Performed: Procedure(s) (LRB): LAPAROSCOPIC CHOLECYSTECTOMY (N/A)  Patient location during evaluation: PACU Anesthesia Type: General Level of consciousness: awake and alert, oriented and patient cooperative Pain management: pain level controlled Vital Signs Assessment: post-procedure vital signs reviewed and stable Respiratory status: spontaneous breathing, nonlabored ventilation and respiratory function stable Cardiovascular status: blood pressure returned to baseline and stable Postop Assessment: no signs of nausea or vomiting Anesthetic complications: no    Last Vitals:  Filed Vitals:   10/04/15 1130 10/04/15 1152  BP: 152/82 148/86  Pulse:  74  Temp: 36.5 C 36.8 C  Resp: 18 18    Last Pain:  Filed Vitals:   10/04/15 1153  PainSc: 0-No pain                 [name],E. Maelie Chriswell

## 2015-10-04 NOTE — Anesthesia Preprocedure Evaluation (Addendum)
Anesthesia Evaluation  Patient identified by MRN, date of birth, ID band Patient awake    Reviewed: Allergy & Precautions, NPO status , Patient's Chart, lab work & pertinent test results  History of Anesthesia Complications Negative for: history of anesthetic complications  Airway Mallampati: II  TM Distance: >3 FB Neck ROM: Full    Dental  (+) Dental Advisory Given, Chipped   Pulmonary neg pulmonary ROS,    breath sounds clear to auscultation       Cardiovascular negative cardio ROS   Rhythm:Regular Rate:Normal     Neuro/Psych negative neurological ROS     GI/Hepatic Neg liver ROS, abd pain with chole   Endo/Other  Morbid obesity  Renal/GU negative Renal ROS     Musculoskeletal   Abdominal (+) + obese,   Peds  Hematology negative hematology ROS (+)   Anesthesia Other Findings   Reproductive/Obstetrics                            Anesthesia Physical Anesthesia Plan  ASA: I  Anesthesia Plan: General   Post-op Pain Management:    Induction: Intravenous  Airway Management Planned: Oral ETT  Additional Equipment:   Intra-op Plan:   Post-operative Plan: Extubation in OR  Informed Consent: I have reviewed the patients History and Physical, chart, labs and discussed the procedure including the risks, benefits and alternatives for the proposed anesthesia with the patient or authorized representative who has indicated his/her understanding and acceptance.   Dental advisory given  Plan Discussed with: Anesthesiologist, Surgeon and CRNA  Anesthesia Plan Comments: (Plan routine monitors, GETA)       Anesthesia Quick Evaluation

## 2015-10-05 ENCOUNTER — Encounter (HOSPITAL_COMMUNITY): Payer: Self-pay | Admitting: General Surgery

## 2015-10-05 MED ORDER — OXYCODONE-ACETAMINOPHEN 5-325 MG PO TABS: 1.0000 | ORAL_TABLET | ORAL | Status: DC | PRN

## 2015-10-05 NOTE — Progress Notes (Signed)
[facility]  OK to discharge to home.

## 2015-10-05 NOTE — Discharge Summary (Signed)
  Physician Discharge Summary  Patient ID: Ronald BushDavid Fritz MRN: [id number] DOB/AGE: 1980/12/16 35 y.o.  Admit date: 10/03/2015 Discharge date: 10/05/2015  Admitting Diagnosis: Acute calculous cholecystitis   Discharge Diagnosis Patient Active Problem List   Diagnosis Date Noted  . Acute cholecystitis 10/03/2015    Consultants none  Imaging: Koreas Abdomen Limited Ruq  10/03/2015  CLINICAL DATA:  Right upper abdominal pain. Cholelithiasis on recent CT EXAM: US ABDOMEN LIMITED - RIGHT UPPER QUADRANT COMPARISON:  CT 09/30/2015 FINDINGS: Gallbladder: Physiologically distended. Calculi measured up to 22 mm diameter. Wall thickening up to 2.8 mm, edematous. Trace pericholecystic fluid. Common bile duct: Diameter: 5.4 mm Liver: No focal lesion identified. Within normal limits in parenchymal echogenicity. Trace perihepatic ascites. IMPRESSION: 1. Cholelithiasis with gallbladder wall thickening suggesting acute cholecystitis. If the clinical picture is inconclusive, hepatobiliary scintigraphy may be useful. Electronically Signed   By: Corlis Leak  Hassell M.D.   On: 10/03/2015 14:27    Procedures Laparoscopic cholecystectomy Dr. Derrell Lollingamirez 10/04/15  HPI: Ronald Fritz is a healthy 35 year old male who presents with ongoing abdominal pain. Started initially middle of the night on Friday night. He was seen at an urgent care on Saturday and diagnosed with constipation. [facility] night at which time LFTs wre normal, WBC was 12.8k, CT a/p with thickened gallbladder and cholelithiasis. He was sent home with Norco, clear liquid diet and to follow up in our office which was scheduled for the 15th. He ran out of norco this morning and had worsening RUQ abdominal pain. Endorses to vomiting on Saturday, but none since. Symptoms are exacerbated by hiccoughs, turning. Somewhat alleviated by norco. Time pattern is constant. Severe in severity. Reports chills. Denies fevers, melena or hematochezia.  Abdominal US suggests wall thickening and stones. LFTs and WBC are normal. We have been asked to evaluate for ongoing pain.    Hospital Course:  Patient was admitted and underwent procedure listed above.  Tolerated procedure well and was transferred to the floor.  Diet was advanced as tolerated.  On POD#1, the patient was voiding well, tolerating diet, ambulating well, pain well controlled, vital signs stable, incisions c/d/i and felt stable for discharge home.  Medication risks, benefits and therapeutic alternatives were reviewed with the patient.  He verbalizes understanding.  Patient will follow up in our office in 3 weeks and knows to call with questions or concerns.  Physical Exam: General:  Alert, NAD, pleasant, comfortable Abd:  Soft, ND, mild tenderness, incisions C/D/I    Medication List    STOP taking these medications        HYDROcodone-acetaminophen 5-325 MG tablet  Commonly known as:  NORCO/VICODIN     ondansetron 4 MG disintegrating tablet  Commonly known as:  ZOFRAN ODT      TAKE these medications        oxyCODONE-acetaminophen 5-325 MG tablet  Commonly known as:  PERCOCET/ROXICET  Take 1-2 tablets by mouth every 4 (four) hours as needed for moderate pain or severe pain.             Follow-up Information    Follow up with [facility] On 10/25/2015.   Specialty:  General Surgery   Why:  arrive by 9:15AM for a 9:45AM post op check   Contact information:   [address] [place] [address] [phone]       Signed: Ashok Norrismina Raye Slyter, [facility]BC [facility] [phone]  10/05/2015, 8:25 AM

## 2015-10-05 NOTE — Progress Notes (Signed)
Per patient surgeon came in and examined his leaking incision and told him this was OK and gave him some gauze to change at home when saturated.   Discharge instructions and medications discussed with patient.  Prescription given to patient.  All questions answered.

## 2015-10-05 NOTE — Discharge Instructions (Signed)

## 2015-10-05 NOTE — Progress Notes (Signed)
Dressing over dressing #1 has been changed 3 times d/t saturation.  Patient concerned because he is discharged home and incision is pooling out fluid.  Emina Riebock with [facility] paged.

## 2016-11-11 ENCOUNTER — Emergency Department (HOSPITAL_COMMUNITY)
Admission: EM | Admit: 2016-11-11 | Discharge: 2016-11-12 | Disposition: A | Discharge status: Home / Self Care | Payer: BLUE CROSS/BLUE SHIELD | Attending: Emergency Medicine | Admitting: Emergency Medicine

## 2016-11-11 ENCOUNTER — Encounter (HOSPITAL_COMMUNITY): Payer: Self-pay

## 2016-11-11 DIAGNOSIS — R45851 Suicidal ideations: Secondary | ICD-10-CM | POA: Diagnosis not present

## 2016-11-11 DIAGNOSIS — R451 Restlessness and agitation: Secondary | ICD-10-CM | POA: Diagnosis present

## 2016-11-11 DIAGNOSIS — F3164 Bipolar disorder, current episode mixed, severe, with psychotic features: Secondary | ICD-10-CM | POA: Diagnosis present

## 2016-11-11 DIAGNOSIS — F3131 Bipolar disorder, current episode depressed, mild: Secondary | ICD-10-CM | POA: Insufficient documentation

## 2016-11-11 DIAGNOSIS — Z5181 Encounter for therapeutic drug level monitoring: Secondary | ICD-10-CM | POA: Insufficient documentation

## 2016-11-11 LAB — ACETAMINOPHEN LEVEL

## 2016-11-11 LAB — COMPREHENSIVE METABOLIC PANEL
ALBUMIN: 4.3 g/dL (ref 3.5–5.0)
ALT: 35 U/L (ref 17–63)
ANION GAP: 10 (ref 5–15)
AST: 48 U/L — AB (ref 15–41)
Alkaline Phosphatase: 49 U/L (ref 38–126)
BUN: 15 mg/dL (ref 6–20)
CHLORIDE: 101 mmol/L (ref 101–111)
CO2: 22 mmol/L (ref 22–32)
Calcium: 8.8 mg/dL — ABNORMAL LOW (ref 8.9–10.3)
Creatinine, Ser: 1.3 mg/dL — ABNORMAL HIGH (ref 0.61–1.24)
GFR calc Af Amer: >60 mL/min (ref >60)
GLUCOSE: 97 mg/dL (ref 65–99)
POTASSIUM: 3.6 mmol/L (ref 3.5–5.1)
Sodium: 133 mmol/L — ABNORMAL LOW (ref 135–145)
Total Bilirubin: 0.5 mg/dL (ref 0.3–1.2)
Total Protein: 7.2 g/dL (ref 6.5–8.1)

## 2016-11-11 LAB — RAPID URINE DRUG SCREEN, HOSP PERFORMED
AMPHETAMINES: NOT DETECTED
BENZODIAZEPINES: NOT DETECTED
Barbiturates: NOT DETECTED
COCAINE: NOT DETECTED
OPIATES: NOT DETECTED
Tetrahydrocannabinol: POSITIVE — AB

## 2016-11-11 LAB — CBC
HCT: 40.3 % (ref 39.0–52.0)
Hemoglobin: 14.5 g/dL (ref 13.0–17.0)
MCH: 29 pg (ref 26.0–34.0)
MCHC: 36 g/dL (ref 30.0–36.0)
MCV: 80.6 fL (ref 78.0–100.0)
PLATELETS: 218 10*3/uL (ref 150–400)
RBC: 5 MIL/uL (ref 4.22–5.81)
RDW: 12.7 % (ref 11.5–15.5)
WBC: 10.1 10*3/uL (ref 4.0–10.5)

## 2016-11-11 LAB — ETHANOL

## 2016-11-11 LAB — SALICYLATE LEVEL: Salicylate Lvl: <7 mg/dL (ref 2.8–30.0)

## 2016-11-11 LAB — LITHIUM LEVEL: LITHIUM LVL: 0.15 mmol/L — AB (ref 0.60–1.20)

## 2016-11-11 MED ORDER — ALUM & MAG HYDROXIDE-SIMETH 200-200-20 MG/5ML PO SUSP: 30.0000 mL | Freq: Four times a day (QID) | ORAL | Status: DC | PRN

## 2016-11-11 MED ORDER — TRAZODONE HCL 50 MG PO TABS
50.0000 mg | ORAL_TABLET | Freq: Every day | ORAL | Status: DC
  Administered 2016-11-12: 50 mg via ORAL
  Filled 2016-11-11: qty 1

## 2016-11-11 MED ORDER — LITHIUM CARBONATE ER 450 MG PO TBCR
450.0000 mg | EXTENDED_RELEASE_TABLET | Freq: Two times a day (BID) | ORAL | Status: DC
  Administered 2016-11-12 (×2): 450 mg via ORAL
  Filled 2016-11-11 (×2): qty 1

## 2016-11-11 MED ORDER — IBUPROFEN 200 MG PO TABS: 600.0000 mg | ORAL_TABLET | Freq: Three times a day (TID) | ORAL | Status: DC | PRN

## 2016-11-11 MED ORDER — ONDANSETRON HCL 4 MG PO TABS: 4.0000 mg | ORAL_TABLET | Freq: Three times a day (TID) | ORAL | Status: DC | PRN

## 2016-11-11 MED ORDER — ACETAMINOPHEN 325 MG PO TABS: 650.0000 mg | ORAL_TABLET | ORAL | Status: DC | PRN
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
## 2016-11-11 NOTE — ED Notes (Signed)
Bed: [id number] Expected date:  Expected time:  Means of arrival:  Comments: [employer]  Voluntary male
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
## 2016-11-12 ENCOUNTER — Other Ambulatory Visit (HOSPITAL_COMMUNITY): Payer: BLUE CROSS/BLUE SHIELD | Attending: Psychiatry | Admitting: Professional

## 2016-11-12 DIAGNOSIS — F3131 Bipolar disorder, current episode depressed, mild: Secondary | ICD-10-CM

## 2016-11-12 DIAGNOSIS — F3164 Bipolar disorder, current episode mixed, severe, with psychotic features: Secondary | ICD-10-CM | POA: Diagnosis present

## 2016-11-12 NOTE — ED Notes (Signed)
[facility] Expected date:  Expected time:  Means of arrival:  Comments: 2522
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
## 2016-11-12 NOTE — ED Notes (Signed)
Pt wanded and placed in room 40.  Pt cooperative, calm and friendly and polite.  Pt sts he just wants to sleep.  Pt currently denies SI, HI and AVH.  Pt verbally contracts for safety.  Pt denies pain at this time.  Pt sts he is hungry and asks for snacks. Pt given snacks and prn meds. Pt remains safe on unit

## 2016-11-12 NOTE — ED Notes (Signed)
Pt discharged home. Discharged instructions read to pt who verbalized understanding. All belongings returned to pt who signed for same. Denies SI/HI, is not delusional and not responding to internal stimuli. Escorted pt to the ED exit.    

## 2016-11-12 NOTE — BH Assessment (Signed)
[facility] Assessment Progress Note  Per Thedore MinsMojeed Akintayo, MD, this pt does not require psychiatric hospitalization at this time.  Pt presents under IVC initiated by his spouse, which Dr Jannifer FranklinAkintayo has rescinded.  Pt is to be referred back to the [facility], his current outpatient provider.  This has been included in pt's discharge instructions.  Pt's nurse, Diane, has been notified.  Doylene Canninghomas Pricsilla Lindvall, MA Triage Specialist [phone]

## 2016-11-12 NOTE — Discharge Instructions (Addendum)
For your ongoing behavioral health needs, you are advised to follow up with the [facility]:       [facility]      [address]      [place] [address]      [phone]
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
## 2016-11-13 ENCOUNTER — Emergency Department (HOSPITAL_COMMUNITY)
Admission: EM | Admit: 2016-11-13 | Discharge: 2016-11-15 | Disposition: A | Discharge status: Other Acute Inpatient Hospital | Payer: BLUE CROSS/BLUE SHIELD | Attending: Emergency Medicine | Admitting: Emergency Medicine

## 2016-11-13 DIAGNOSIS — F919 Conduct disorder, unspecified: Secondary | ICD-10-CM | POA: Diagnosis not present

## 2016-11-13 DIAGNOSIS — F3164 Bipolar disorder, current episode mixed, severe, with psychotic features: Secondary | ICD-10-CM | POA: Diagnosis not present

## 2016-11-13 DIAGNOSIS — G4700 Insomnia, unspecified: Secondary | ICD-10-CM | POA: Diagnosis not present

## 2016-11-13 DIAGNOSIS — Z79899 Other long term (current) drug therapy: Secondary | ICD-10-CM | POA: Insufficient documentation

## 2016-11-13 DIAGNOSIS — F3113 Bipolar disorder, current episode manic without psychotic features, severe: Secondary | ICD-10-CM | POA: Insufficient documentation

## 2016-11-13 DIAGNOSIS — R4689 Other symptoms and signs involving appearance and behavior: Secondary | ICD-10-CM

## 2016-11-13 NOTE — ED Triage Notes (Signed)
Patient is being brought in by his mother for manic behavior at home.  Patient has started a new medication today and had a change in his behavior at home.  Patient is limited on what his is answering but is agitated and states that he wants to go home.

## 2016-11-14 ENCOUNTER — Telehealth (HOSPITAL_COMMUNITY): Payer: Self-pay | Admitting: Professional

## 2016-11-14 DIAGNOSIS — Z79899 Other long term (current) drug therapy: Secondary | ICD-10-CM | POA: Diagnosis not present

## 2016-11-14 DIAGNOSIS — F3164 Bipolar disorder, current episode mixed, severe, with psychotic features: Secondary | ICD-10-CM | POA: Diagnosis not present

## 2016-11-14 DIAGNOSIS — G4700 Insomnia, unspecified: Secondary | ICD-10-CM

## 2016-11-14 LAB — LITHIUM LEVEL
LITHIUM LVL: 0.51 mmol/L — AB (ref 0.60–1.20)
Lithium Lvl: 0.48 mmol/L — ABNORMAL LOW (ref 0.60–1.20)

## 2016-11-14 LAB — RAPID URINE DRUG SCREEN, HOSP PERFORMED
Amphetamines: NOT DETECTED
BARBITURATES: NOT DETECTED
BENZODIAZEPINES: NOT DETECTED
Cocaine: NOT DETECTED
Opiates: NOT DETECTED
Tetrahydrocannabinol: POSITIVE — AB

## 2016-11-14 LAB — BASIC METABOLIC PANEL
ANION GAP: 10 (ref 5–15)
BUN: 10 mg/dL (ref 6–20)
CALCIUM: 9.4 mg/dL (ref 8.9–10.3)
CO2: 24 mmol/L (ref 22–32)
Chloride: 104 mmol/L (ref 101–111)
Creatinine, Ser: 1.12 mg/dL (ref 0.61–1.24)
Glucose, Bld: 107 mg/dL — ABNORMAL HIGH (ref 65–99)
Potassium: 3.3 mmol/L — ABNORMAL LOW (ref 3.5–5.1)
SODIUM: 138 mmol/L (ref 135–145)

## 2016-11-14 LAB — CBC WITH DIFFERENTIAL/PLATELET
BASOS ABS: 0 10*3/uL (ref 0.0–0.1)
BASOS PCT: 1 %
Eosinophils Absolute: 0 10*3/uL (ref 0.0–0.7)
Eosinophils Relative: 1 %
HCT: 40.8 % (ref 39.0–52.0)
Hemoglobin: 14.4 g/dL (ref 13.0–17.0)
Lymphocytes Relative: 22 %
Lymphs Abs: 1.8 10*3/uL (ref 0.7–4.0)
MCH: 29 pg (ref 26.0–34.0)
MCHC: 35.3 g/dL (ref 30.0–36.0)
MCV: 82.3 fL (ref 78.0–100.0)
Monocytes Absolute: 0.7 10*3/uL (ref 0.1–1.0)
Monocytes Relative: 9 %
NEUTROS PCT: 67 %
Neutro Abs: 5.3 10*3/uL (ref 1.7–7.7)
PLATELETS: 201 10*3/uL (ref 150–400)
RBC: 4.96 MIL/uL (ref 4.22–5.81)
RDW: 13.2 % (ref 11.5–15.5)
WBC: 7.9 10*3/uL (ref 4.0–10.5)

## 2016-11-14 LAB — ETHANOL

## 2016-11-14 MED ORDER — ACETAMINOPHEN 325 MG PO TABS: 650.0000 mg | ORAL_TABLET | ORAL | Status: DC | PRN

## 2016-11-14 MED ORDER — ONDANSETRON HCL 4 MG PO TABS: 4.0000 mg | ORAL_TABLET | Freq: Three times a day (TID) | ORAL | Status: DC | PRN

## 2016-11-14 MED ORDER — POTASSIUM CHLORIDE CRYS ER 20 MEQ PO TBCR
40.0000 meq | EXTENDED_RELEASE_TABLET | Freq: Once | ORAL | Status: AC
  Administered 2016-11-14: 40 meq via ORAL
  Filled 2016-11-14: qty 2

## 2016-11-14 MED ORDER — ZOLPIDEM TARTRATE 5 MG PO TABS
5.0000 mg | ORAL_TABLET | Freq: Every evening | ORAL | Status: DC | PRN
  Filled 2016-11-14: qty 1

## 2016-11-14 MED ORDER — LITHIUM CARBONATE ER 450 MG PO TBCR
450.0000 mg | EXTENDED_RELEASE_TABLET | Freq: Two times a day (BID) | ORAL | Status: DC
  Administered 2016-11-14 – 2016-11-15 (×4): 450 mg via ORAL
  Filled 2016-11-14 (×4): qty 1

## 2016-11-14 MED ORDER — OLANZAPINE 5 MG PO TABS
5.0000 mg | ORAL_TABLET | Freq: Two times a day (BID) | ORAL | Status: DC
  Administered 2016-11-14 (×2): 5 mg via ORAL
  Filled 2016-11-14 (×2): qty 1

## 2016-11-14 MED ORDER — OLANZAPINE 10 MG PO TABS
10.0000 mg | ORAL_TABLET | Freq: Two times a day (BID) | ORAL | Status: DC
  Administered 2016-11-14 – 2016-11-15 (×2): 10 mg via ORAL
  Filled 2016-11-14 (×2): qty 1

## 2016-11-14 MED ORDER — TRAZODONE HCL 50 MG PO TABS
50.0000 mg | ORAL_TABLET | Freq: Every day | ORAL | Status: DC
  Administered 2016-11-14: 50 mg via ORAL
  Filled 2016-11-14: qty 1

## 2016-11-14 MED ORDER — HALOPERIDOL LACTATE 5 MG/ML IJ SOLN
5.0000 mg | Freq: Once | INTRAMUSCULAR | Status: AC
  Administered 2016-11-14: 5 mg via INTRAMUSCULAR
  Filled 2016-11-14: qty 1

## 2016-11-14 MED ORDER — IBUPROFEN 200 MG PO TABS: 600.0000 mg | ORAL_TABLET | Freq: Three times a day (TID) | ORAL | Status: DC | PRN

## 2016-11-14 MED ORDER — ALUM & MAG HYDROXIDE-SIMETH 200-200-20 MG/5ML PO SUSP: 30.0000 mL | Freq: Four times a day (QID) | ORAL | Status: DC | PRN

## 2016-11-14 MED ORDER — LORAZEPAM 1 MG PO TABS
2.0000 mg | ORAL_TABLET | Freq: Once | ORAL | Status: AC | PRN
  Administered 2016-11-14: 2 mg via ORAL
  Filled 2016-11-14: qty 2

## 2016-11-14 NOTE — ED Notes (Signed)
This nurse and Edie, RN at bedside, pt presents with extreme tremors. Vital signs obtained, tremors subsided. Pt reports he feels better. This nurse notified EDP. Ordered to contact EDP if occurs again. Will continue to monitor.
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
## 2016-11-14 NOTE — ED Notes (Signed)
Rondell ReamsMom, Kathie Troxler, [phone] would like to contacted about pt care.

## 2016-11-14 NOTE — ED Notes (Signed)
This nurse notified phlebotomist of ordered Lithium level.

## 2016-11-14 NOTE — ED Notes (Signed)
Pt presents with increase agitation,  Paranoia. Pt reports he hears his mother through the wall. This nurse notified De BurrsLaurie Park NP of pt behavior. Will continue to monitor.

## 2016-11-14 NOTE — ED Notes (Signed)
Pt presents with manic like behavior, dancing, singing, laughing inappropraitely in hallway. Encouragement and support provided. Special checks q 15 mins in place for safety, Video monitoring in place. Will continue to monitor.

## 2016-11-14 NOTE — BH Assessment (Signed)
[facility] Assessment Progress Note  Per Thedore MinsMojeed Akintayo, MD, this pt requires psychiatric hospitalization at this time.  He also meets criteria for IVC, which Dr Jannifer FranklinAkintayo has initiated.  [place] [facility], and at 11:49 Burna CashMagistrate Morton confirms receipt.  Findings and Custody Order has been served.  The following facilities have been contacted to seek placement for this pt, with results as noted:  Beds available, information sent, decision pending:  [facility]   At capacity:  [facility]   Kennith Morss, [place] Triage Specialist [phone]
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
## 2016-11-14 NOTE — Progress Notes (Signed)
11/14/16 1337:  Pt was sitting in the dayroom in the.  Pt left when LRT and another pt entered to do activities.   Caroll RancherMarjette Garritt Molyneux, LRT/CTRS

## 2016-11-14 NOTE — ED Notes (Signed)
[place]

## 2016-11-14 NOTE — Progress Notes (Signed)
Pt on unit in room 40.  Pt is agitated.  "Where is mom, Ill just hang out with her".  Pt directed back to his room and advised his mom would not be on unit and that he was not leaving and that MD would see him in the am.

## 2016-11-14 NOTE — ED Notes (Signed)
Pt wanded by security. 

## 2016-11-15 ENCOUNTER — Inpatient Hospital Stay (HOSPITAL_COMMUNITY)
Admission: AD | Admit: 2016-11-15 | Discharge: 2016-11-20 | DRG: 885 | Disposition: A | Discharge status: Home / Self Care | Payer: BLUE CROSS/BLUE SHIELD | Attending: Psychiatry | Admitting: Psychiatry

## 2016-11-15 ENCOUNTER — Encounter (HOSPITAL_COMMUNITY): Payer: Self-pay

## 2016-11-15 DIAGNOSIS — F419 Anxiety disorder, unspecified: Secondary | ICD-10-CM | POA: Diagnosis present

## 2016-11-15 DIAGNOSIS — F250 Schizoaffective disorder, bipolar type: Secondary | ICD-10-CM | POA: Diagnosis present

## 2016-11-15 DIAGNOSIS — F3113 Bipolar disorder, current episode manic without psychotic features, severe: Secondary | ICD-10-CM | POA: Diagnosis not present

## 2016-11-15 DIAGNOSIS — F3164 Bipolar disorder, current episode mixed, severe, with psychotic features: Secondary | ICD-10-CM | POA: Diagnosis not present

## 2016-11-15 DIAGNOSIS — Z79899 Other long term (current) drug therapy: Secondary | ICD-10-CM

## 2016-11-15 DIAGNOSIS — F319 Bipolar disorder, unspecified: Secondary | ICD-10-CM | POA: Insufficient documentation

## 2016-11-15 MED ORDER — OLANZAPINE 10 MG PO TABS
10.0000 mg | ORAL_TABLET | Freq: Two times a day (BID) | ORAL | Status: DC
  Administered 2016-11-15 – 2016-11-18 (×6): 10 mg via ORAL
  Filled 2016-11-15 (×12): qty 1

## 2016-11-15 MED ORDER — ACETAMINOPHEN 325 MG PO TABS: 650.0000 mg | ORAL_TABLET | Freq: Four times a day (QID) | ORAL | Status: DC | PRN

## 2016-11-15 MED ORDER — TRAZODONE HCL 100 MG PO TABS: 100.0000 mg | ORAL_TABLET | Freq: Every day | ORAL | Status: DC

## 2016-11-15 MED ORDER — IBUPROFEN 600 MG PO TABS: 600.0000 mg | ORAL_TABLET | Freq: Three times a day (TID) | ORAL | Status: DC | PRN

## 2016-11-15 MED ORDER — ALUM & MAG HYDROXIDE-SIMETH 200-200-20 MG/5ML PO SUSP: 30.0000 mL | Freq: Four times a day (QID) | ORAL | Status: DC | PRN

## 2016-11-15 MED ORDER — ACETAMINOPHEN 325 MG PO TABS: 650.0000 mg | ORAL_TABLET | ORAL | Status: DC | PRN

## 2016-11-15 MED ORDER — TRAZODONE HCL 100 MG PO TABS
100.0000 mg | ORAL_TABLET | Freq: Every day | ORAL | Status: DC
  Administered 2016-11-15 – 2016-11-17 (×3): 100 mg via ORAL
  Filled 2016-11-15 (×6): qty 1

## 2016-11-15 MED ORDER — ONDANSETRON HCL 4 MG PO TABS
4.0000 mg | ORAL_TABLET | Freq: Three times a day (TID) | ORAL | Status: DC | PRN
Start: 2016-11-15 — End: 2016-11-20

## 2016-11-15 MED ORDER — ALUM & MAG HYDROXIDE-SIMETH 200-200-20 MG/5ML PO SUSP: 30.0000 mL | ORAL | Status: DC | PRN

## 2016-11-15 MED ORDER — LITHIUM CARBONATE ER 450 MG PO TBCR
450.0000 mg | EXTENDED_RELEASE_TABLET | Freq: Two times a day (BID) | ORAL | Status: DC
  Administered 2016-11-15 – 2016-11-20 (×10): 450 mg via ORAL
  Filled 2016-11-15 (×14): qty 1

## 2016-11-15 MED ORDER — MAGNESIUM HYDROXIDE 400 MG/5ML PO SUSP: 30.0000 mL | Freq: Every day | ORAL | Status: DC | PRN

## 2016-11-15 NOTE — ED Notes (Signed)
[employer] called for transport 

## 2016-11-15 NOTE — BH Assessment (Addendum)
[facility] Assessment Progress Note  Per Thedore MinsMojeed Akintayo, MD, this pt requires psychiatric hospitalization.  Malva LimesLinsey Strader, RN, [facility]; they will call when they are ready to receive pt.  Pt presents under IVC initiated by Dr Jannifer FranklinAkintayo, and [facility].  Pt's nurse, Morrie Sheldonshley, has been notified, and agrees to call report to [phone].  Pt is to be transported via [employer].   Doylene Canninghomas Halina Asano, MA Triage Specialist [phone]   Addendum:  At 14:10 Burnadette PeterLisney Strader calls.  [facility] will be ready for this pt at 14:30.  Pt's nurse has been notified.  Doylene Canninghomas Fenton Candee, MA Triage Specialist [phone]

## 2016-11-15 NOTE — ED Notes (Signed)
[facility] on unit [facility] per MD order. Pt signed for personal property and Pt personal property given to [employer] for transport. Pt ambulatory off unit in police custody.
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
## 2016-11-15 NOTE — Progress Notes (Signed)
Patient woke up around 2 am pacing.

## 2016-11-15 NOTE — Progress Notes (Signed)
Ronald Fritz is a 36 year old male being admitted involuntarily to [facility] from [facility].  He presented to the ED with his mother for disruptive behavior.  He apparently was taking his clothes off, running around the neighborhood and screaming.  Police found him later on.  Mother reported that he hasn't been sleeping and [facility] and has decompensated since then.  No medical history noted.  He his diagnosed with Bipolar Disorder, Manic.  Ronald Fritz [facility].  He was however intrusive.  He would redirect easily.  He denied any SI/HI or A/V hallucinations.  He did report that his main complaint is that "I haven't been sleeping well."  He denied any pain or discomfort and appeared to be in no physical distress.  Oriented him to the unit. Admission paperwork completed and signed.  Belongings searched and no belongings needed to be locked in locker.  Skin assessment completed and no skin issues noted.  Q 15 minute checks initiated for safety.  We will monitor the progress towards his goals.

## 2016-11-15 NOTE — Consult Note (Signed)
[facility] Psych ED Progress Note  11/15/2016 11:06 AM Emanuell Morina  MRN:  [id number] Subjective:  ''I am hearing voices-my mother's voice through the wall.''  Objective:  Patient seen, chart reviewed and case discussed with the team. He reports being diagnosed with Bipolar disorder and was prescribed Lithium and other medications. He was brought to [facility] by his family due to manic behavior, agitation, suicidal thoughts and making threatening remarks to his wife. Patient reports hearing voices , he has been acting bizarre, disorganized and irritable. Patient denies alcohol abuse but has been smoking Cannabis on occasional.  Principal Problem: Bipolar disord, crnt episode mixed, severe, w psych features [facility]) Diagnosis:   Patient Active Problem List   Diagnosis Date Noted  . Bipolar disord, crnt episode mixed, severe, w psych features [facility]) [F31.64] 11/12/2016    Priority: High  . Acute cholecystitis [K81.0] 10/03/2015   Total Time spent with patient: 30 minutes  Past Psychiatric History: as above  Past Medical History: No past medical history on file.  Past Surgical History:  Procedure Laterality Date  . CHOLECYSTECTOMY N/A 10/04/2015   Procedure: LAPAROSCOPIC CHOLECYSTECTOMY;  Surgeon: Ralene Ok, MD;  Location: [place];  Service: General;  Laterality: N/A;   Family History: No family history on file. Family Psychiatric  History:  Social History:  History  Alcohol Use No     History  Drug Use No    Social History   Social History  . Marital status: Married    Spouse name: N/A  . Number of children: N/A  . Years of education: N/A   Social History Main Topics  . Smoking status: Never Smoker  . Smokeless tobacco: Not on file  . Alcohol use No  . Drug use: No  . Sexual activity: Not on file   Other Topics Concern  . Not on file   Social History Narrative  . No narrative on file    Sleep: Poor  Appetite:  Fair  Current Medications: Current  Facility-Administered Medications  Medication Dose Route Frequency Provider Last Rate Last Dose  . acetaminophen (TYLENOL) tablet 650 mg  650 mg Oral A9V PRN Delora Fuel, MD      . alum & mag hydroxide-simeth (MAALOX/MYLANTA) 200-200-20 MG/5ML suspension 30 mL  30 mL Oral B1Y PRN Delora Fuel, MD      . ibuprofen (ADVIL,MOTRIN) tablet 600 mg  600 mg Oral O0A PRN Delora Fuel, MD      . lithium carbonate (ESKALITH) CR tablet 450 mg  004 mg Oral BID Delora Fuel, MD   599 mg at 11/15/16 7741  . OLANZapine (ZYPREXA) tablet 10 mg  10 mg Oral BID Corena Pilgrim, MD   10 mg at 11/15/16 0927  . ondansetron (ZOFRAN) tablet 4 mg  4 mg Oral S2L PRN Delora Fuel, MD      . traZODone (DESYREL) tablet 100 mg  100 mg Oral QHS Corena Pilgrim, MD       Current Outpatient Prescriptions  Medication Sig Dispense Refill  . lithium carbonate (ESKALITH) 450 MG CR tablet Take 450 mg by mouth 2 (two) times daily.  0  . OLANZapine (ZYPREXA) 5 MG tablet Take 5 mg by mouth 2 (two) times daily.     . traZODone (DESYREL) 50 MG tablet Take 50 mg by mouth at bedtime.  0    Lab Results:  Results for orders placed or performed during the hospital encounter of 11/13/16 (from the past 48 hour(s))  Basic metabolic panel  Status: Abnormal   Collection Time: 11/14/16 12:18 AM  Result Value Ref Range   Sodium 138 135 - 145 mmol/L   Potassium 3.3 (L) 3.5 - 5.1 mmol/L   Chloride 104 101 - 111 mmol/L   CO2 24 22 - 32 mmol/L   Glucose, Bld 107 (H) 65 - 99 mg/dL   BUN 10 6 - 20 mg/dL   Creatinine, Ser 1.12 0.61 - 1.24 mg/dL   Calcium 9.4 8.9 - 10.3 mg/dL   GFR calc non Af Amer >60 >60 mL/min   GFR calc Af Amer >60 >60 mL/min    Comment: (NOTE) The eGFR has been calculated using the CKD EPI equation. This calculation has not been validated in all clinical situations. eGFR's persistently <60 mL/min signify possible Chronic Kidney Disease.    Anion gap 10 5 - 15  CBC with Differential     Status: None   Collection  Time: 11/14/16 12:18 AM  Result Value Ref Range   WBC 7.9 4.0 - 10.5 K/uL   RBC 4.96 4.22 - 5.81 MIL/uL   Hemoglobin 14.4 13.0 - 17.0 g/dL   HCT 40.8 39.0 - 52.0 %   MCV 82.3 78.0 - 100.0 fL   MCH 29.0 26.0 - 34.0 pg   MCHC 35.3 30.0 - 36.0 g/dL   RDW 13.2 11.5 - 15.5 %   Platelets 201 150 - 400 K/uL   Neutrophils Relative % 67 %   Neutro Abs 5.3 1.7 - 7.7 K/uL   Lymphocytes Relative 22 %   Lymphs Abs 1.8 0.7 - 4.0 K/uL   Monocytes Relative 9 %   Monocytes Absolute 0.7 0.1 - 1.0 K/uL   Eosinophils Relative 1 %   Eosinophils Absolute 0.0 0.0 - 0.7 K/uL   Basophils Relative 1 %   Basophils Absolute 0.0 0.0 - 0.1 K/uL   Smear Review MORPHOLOGY UNREMARKABLE   Ethanol     Status: None   Collection Time: 11/14/16 12:19 AM  Result Value Ref Range   Alcohol, Ethyl (B) <5 <5 mg/dL    Comment:        LOWEST DETECTABLE LIMIT FOR SERUM ALCOHOL IS 5 mg/dL FOR MEDICAL PURPOSES ONLY   Urine rapid drug screen (hosp performed)     Status: Abnormal   Collection Time: 11/14/16 12:19 AM  Result Value Ref Range   Opiates NONE DETECTED NONE DETECTED   Cocaine NONE DETECTED NONE DETECTED   Benzodiazepines NONE DETECTED NONE DETECTED   Amphetamines NONE DETECTED NONE DETECTED   Tetrahydrocannabinol POSITIVE (A) NONE DETECTED   Barbiturates NONE DETECTED NONE DETECTED    Comment:        DRUG SCREEN FOR MEDICAL PURPOSES ONLY.  IF CONFIRMATION IS NEEDED FOR ANY PURPOSE, NOTIFY LAB WITHIN 5 DAYS.        LOWEST DETECTABLE LIMITS FOR URINE DRUG SCREEN Drug Class       Cutoff (ng/mL) Amphetamine      1000 Barbiturate      200 Benzodiazepine   151 Tricyclics       761 Opiates          300 Cocaine          300 THC              50   Lithium level     Status: Abnormal   Collection Time: 11/14/16 12:19 AM  Result Value Ref Range   Lithium Lvl 0.51 (L) 0.60 - 1.20 mmol/L  Lithium level     Status: Abnormal  Collection Time: 11/14/16  6:47 PM  Result Value Ref Range   Lithium Lvl 0.48 (L)  0.60 - 1.20 mmol/L    Blood Alcohol level:  Lab Results  Component Value Date   ETH <5 11/14/2016   ETH <5 11/11/2016    Physical Findings: AIMS:  , ,  ,  ,    CIWA:    COWS:     Musculoskeletal: Strength & Muscle Tone: within normal limits Gait & Station: normal Patient leans: N/A  Psychiatric Specialty Exam: Physical Exam  Psychiatric: His mood appears anxious. His affect is labile. His speech is rapid and/or pressured. He is agitated, aggressive, actively hallucinating and combative. Thought content is paranoid and delusional. Cognition and memory are normal. He expresses impulsivity. He expresses homicidal and suicidal ideation.    Review of Systems  Constitutional: Negative.   HENT: Negative.   Eyes: Negative.   Respiratory: Negative.   Cardiovascular: Negative.   Gastrointestinal: Negative.   Genitourinary: Negative.   Musculoskeletal: Negative.   Skin: Negative.   Neurological: Negative.   Endo/Heme/Allergies: Negative.   Psychiatric/Behavioral: Positive for hallucinations, substance abuse and suicidal ideas. The patient is nervous/anxious.     Blood pressure 103/84, pulse (!) 103, temperature 98.6 F (37 C), temperature source Oral, resp. rate 17, height _0  (1.88 m), weight 117.9 kg (260 lb), SpO2 97 %.Body mass index is 33.38 kg/m.  General Appearance: Disheveled  Eye Contact:  Minimal  Speech:  Pressured  Volume:  Increased  Mood:  Irritable  Affect:  Labile  Thought Process:  Disorganized  Orientation:  Full (Time, Place, and Person)  Thought Content:  Illogical, Delusions and Hallucinations: Auditory  Suicidal Thoughts:  Yes.  without intent/plan  Homicidal Thoughts:  Yes.  without intent/plan  Memory:  Immediate;   Fair Recent;   Fair Remote;   Fair  Judgement:  Poor  Insight:  Lacking  Psychomotor Activity:  Increased and Restlessness  Concentration:  Concentration: Fair and Attention Span: Fair  Recall:  [employer] of Knowledge:  Fair   Language:  Good  Akathisia:  No  Handed:  Right  AIMS (if indicated):     Assets:  Communication Skills Social Support  ADL's:  Intact  Cognition:  WNL  Sleep:   fair      Treatment Plan Summary: Daily contact with patient to assess and evaluate symptoms and progress in treatment and Medication managementContinue Lithium CR 450 mg bid for Bipolar and increase Zyprexa 10 mg bid for psychosis. Patient needs inpatient admission for stabilization.  Corena Pilgrim, MD 11/15/2016, 11:06 AM

## 2016-11-15 NOTE — Progress Notes (Signed)
11/15/16 1257:  LRT introduced self to pt and offered activities.  Pt met LRT in dayroom and observed as his peers participated in activities.  Pt was pleasant.  Pt stayed for a few minutes before going back to his room to watch a movie.  Victorino Sparrow, LRT/CTRS

## 2016-11-15 NOTE — Progress Notes (Signed)
Adult Psychoeducational Group Note  Date:  11/15/2016 Time:  9:53 PM  Group Topic/Focus:  Wrap-Up Group:   The focus of this group is to help patients review their daily goal of treatment and discuss progress on daily workbooks.  Participation Level:  Minimal  Participation Quality:  Appropriate  Affect:  Appropriate  Cognitive:  Appropriate  Insight: Appropriate  Engagement in Group:  Engaged  Modes of Intervention:  Socialization and Support  Additional Comments:  Patient attended and participated in group tonight.  He reports having a great day. Today he prayed, took his medication,went for his meals and went for his meals. His mother visited and that was a good visit. He has not slept in weeks. He advised that he does not stress anything that is not serious.   Ronald Fritz, Ronald Fritz [facility] 11/15/2016, 9:53 PM

## 2016-11-15 NOTE — ED Notes (Signed)
Pt pleasant on approach, disorganized. Pt observed dancing in the hallway, wandering into empty pt rooms, not easily redirectable. Pt denies SI/HI. Encouragement and support provided. Special checks q 15 mins in place for safety, Video monitoring in place. Will continue to monitor.

## 2016-11-15 NOTE — ED Notes (Signed)
Pt mother on unit visiting with pt.  

## 2016-11-16 DIAGNOSIS — F250 Schizoaffective disorder, bipolar type: Secondary | ICD-10-CM

## 2016-11-16 NOTE — BHH Counselor (Signed)
Adult Comprehensive Assessment  Patient ID: Ronald Fritz, male   DOB: March 18, 1981, 54 Y.[place]   MRN: [id number]  Information Source: Patient    Current Stressors:  Educational / Learning stressors: NA Employment / Job issues: Patient reports "work/life balance issues but it'll be better as they are cutting back my hours soon" Family Relationships: Pt denies yet according to pt's mother wife left the home due to fear of his situation & safety concerns Financial / Lack of resources (include bankruptcy): "Strained" Housing / Lack of housing: NA Physical health (include injuries & life threatening diseases): NA ( was recently discharged from [facility], mid June) Social relationships: NA Substance abuse: Pt denies Bereavement / Loss: Pt denies  Living/Environment/Situation:  Living Arrangements: Spouse/significant other Living conditions (as described by patient or guardian): Home of 2 years How long has patient lived in current situation?: 2 years What is atmosphere in current home: Comfortable, Paramedic, Supportive  Family History:  Marital status: Married Number of Years Married: 4 What types of issues is patient dealing with in the relationship?: Some financial issues and strain due to pt walking the streets of neighborhood undressed Additional relationship information: Pt reports no issues other than financial stress and stress of two young children Are you sexually active?: Yes What is your sexual orientation?: Heterosexual Has your sexual activity been affected by drugs, alcohol, medication, or emotional stress?: "I don't believe so" Does patient have children?: Yes How many children?: 2 How is patient's relationship with their children?: Good with both 2 YO and 3.5 YO  Childhood History:  By whom was/is the patient raised?: Mother Additional childhood history information: Father died when pt was 4 YO Description of patient's relationship with caregiver when they were a child:  Good with mother Patient's description of current relationship with people who raised him/her: Good with mother How were you disciplined when you got in trouble as a child/adolescent?: "I was never disciplined; good kid" Does patient have siblings?: Yes Number of Siblings: 2 Description of patient's current relationship with siblings: Not close to brother and sister who are in [place] Did patient suffer any verbal/emotional/physical/sexual abuse as a child?: No Did patient suffer from severe childhood neglect?: No Has patient ever been sexually abused/assaulted/raped as an adolescent or adult?: No Was the patient ever a victim of a crime or a disaster?: No Witnessed domestic violence?: No Has patient been effected by domestic violence as an adult?: No  Education:  Highest grade of school patient has completed: Some college Currently a Consulting civil engineer?: No Learning disability?: No  Employment/Work Situation:   Employment situation: Employed Where is patient currently employed?: [employer] How long has patient been employed?: 3 years Patient's job has been impacted by current illness: Yes Describe how patient's job has been impacted: "It's been difficult with work life balance" What is the longest time patient has a held a job?: 3 years Where was the patient employed at that time?: current job Has patient ever been in the [employer]?: No Has patient ever served in combat?: No  Financial Resources:   Financial resources: Income from employment Does patient have a representative payee or guardian?: No  Alcohol/Substance Abuse:   What has been your use of drugs/alcohol within the last 12 months?: THC "Maybe once a month" Alcohol/Substance Abuse Treatment Hx: Denies past history Has alcohol/substance abuse ever caused legal problems?: No  Social Support System:   Conservation officer, nature Support System: Fair Development worker, community Support System: Wife and mother Type of faith/religion: Ephriam Knuckles How does  patient's faith help to cope with current illness?: "It;s my goal post"  Leisure/Recreation:   Leisure and Hobbies: "Music, movies, books"  Strengths/Needs:   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; 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 In what areas does patient struggle / problems for patient: "Work life balance"  Discharge Plan:   Does patient have access to transportation?: Yes Will patient be returning to same living situation after discharge?: Yes Currently receiving community mental health services: Yes (From Whom) ([facility]) Does patient have financial barriers related to discharge medications?: No  Summary/Recommendations:   Summary and Recommendations (to be completed by the evaluator): Patient is 36 YO married employed male admitted  IVC with Bipolar Disorder following recent discharge from [facility] the previous week after exhibiting manic behaviors. Stressors for patient include employment (which reportedly create issues with work life balance), recent inpatient psych hospitalization, and new medications.  Patient will benefit from crisis stabilization, medication evaluation, group therapy and psycho education, in addition to case management for discharge planning. At discharge it is recommended that patient adhere to the established discharge plan and continue in treatment.  Ronald Fritz. 11/16/2016

## 2016-11-16 NOTE — Progress Notes (Signed)
Patient ID: Ria BushDavid Cieslik, male   DOB: 11/11/1980, 36 y.o.   MRN: [id number]    D: Pt has been very flat and depressed on the unit today. Pt goes to group but has minimal participation. Pt reported that his depression was a 0, his hopelessness was a 0, and his anxiety was a 0. Pt reported that his goal for today was to enjoy life. Pt has taken all medications as prescribed by the doctor. Pt reported being negative SI/HI, no AH/VH noted. A: 15 min checks continued for patient safety. R: Pt safety maintained.

## 2016-11-16 NOTE — BHH Group Notes (Signed)
BHH Therapy Group Note:  (Clinical Social Work)  11/16/2016  10:00-11:00AM  Summary of Progress/Problems:   Today's process group involved patients discussing their feelings related to being hospitalized, as well as benefits they see to being in the hospital.  These were itemized and then the group brainstormed specific ways in which they could seek for those same benefits to happen when they discharge and go back home. The patient expressed a primary feeling about being hospitalized is "quite well" and said he came under IVC but is waiting "for my family to be ready to receive me, and that takes time, patience.  Nothing good happens too fast."  He was pleasant and supportive of other patients throughout group.  Type of Therapy:  Group Therapy - Process  Participation Level:  Active  Participation Quality:  Attentive and Supportive  Affect:  Not Congruent  Cognitive:  Disorganized  Insight:  Improving  Engagement in Therapy:  Engaged  Modes of Intervention:  Exploration, Discussion  Ambrose MantleMareida Grossman-Orr, LCSW 11/16/2016, 12:46 PM

## 2016-11-16 NOTE — Progress Notes (Signed)
Pt at the time of assessment was flat and withdrawn to room-remained in bed for greater part of the evening. Pt at this time denied SI, HI, depression, anxiety or AVH; states, "I shouldn't even be here in the first place." Pt remained calm and cooperative.   A: Medications offered as prescribed. All patient's questions and concerns addressed. Support, encouragement, and safe environment provided. 15-minute safety checks continue. R: Pt was med compliant. Pt did not attend wrap-up group. Safety checks continue.

## 2016-11-16 NOTE — BHH Suicide Risk Assessment (Signed)
[facility] Admission Suicide Risk Assessment   Nursing information obtained from:  Patient Demographic factors:  Caucasian Current Mental Status:  NA Loss Factors:  Financial problems / change in socioeconomic status Historical Factors:  Family history of mental illness or substance abuse Risk Reduction Factors:  Responsible for children under 35 years of age, Employed, Living with another person, especially a relative  Total Time spent with patient: 45 minutes Principal Problem: Schizoaffective disorder, bipolar type (HCC) Diagnosis:   Patient Active Problem List   Diagnosis Date Noted  . Schizoaffective disorder, bipolar type (HCC) [F25.0] 11/16/2016  . 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 Bipolar disord, crnt episode mixed, severe, w psych features (HCC) [F31.64] 11/12/2016  . Acute cholecystitis [K81.0] 10/03/2015   Subjective Data:   36 yo Caucasian male, married, employed. Lives with his family. Background history of schizoaffective disorder. Has had multiple hospitalizations lately. Patient was discharged from [facility] two days prior to presentation. He was involuntarily committed by his family. He was reported to be acting bizarre. He was taking his clothes off in the street. He was stated to he threatening towards his wife. He was talking to his father who passed years ago. He was saying weird things like machines taking over.  At interview, patient says he feels safe here. Says he has no intention to hurt himself or to hurt anyone. Says he has not slept in days. Says he does not need sleep. Feels his mind is working really well. Reports very good energy levels. Dismissed hallucination in any modality. Not expressing any grandiose ideas at this time. Patient says he is on Lithium and other medications. He states that he would take his medications as prescribed.   Continued Clinical Symptoms:  Alcohol Use Disorder Identification Test Final Score (AUDIT): 0 The  "Alcohol Use Disorders Identification Test", Guidelines for Use in Primary Care, Second Edition.  World Science writer [facility]). Score between 0-7:  no or low risk or alcohol related problems. Score between 8-15:  moderate risk of alcohol related problems. Score between 16-19:  high risk of alcohol related problems. Score 20 or above:  warrants further diagnostic evaluation for alcohol dependence and treatment.   CLINICAL FACTORS:  Psychosis Mania   Musculoskeletal: Strength & Muscle Tone: within normal limits Gait & Station: normal Patient leans: N/A  Psychiatric Specialty Exam: Physical Exam  Constitutional: He is oriented to person, place, and time. He appears well-developed and well-nourished.  HENT:  Head: Normocephalic.  Eyes: Pupils are equal, round, and reactive to light.  Neck: Normal range of motion. Neck supple.  Cardiovascular: Normal rate.   Respiratory: Effort normal.  Musculoskeletal: Normal range of motion.  Neurological: He is alert and oriented to person, place, and time.  Skin: Skin is warm and dry.  Psychiatric:  As above    ROS  Blood pressure (!) 152/118, pulse 92, temperature 97.6 F (36.4 C), resp. rate 18, height 6\' 3"  (1.905 m), weight 115.2 kg (254 lb), SpO2 99 %.Body mass index is 31.75 kg/m.  General Appearance: Well built, pleasant and polite. Engaged well.   Eye Contact:  Good  Speech:  Clear and Coherent and Normal Rate  Volume:  Normal  Mood:  Euphoric  Affect:  Congruent  Thought Process:  Linear  Orientation:  Full (Time, Place, and Person)  Thought Content:  Odd beliefs  Suicidal Thoughts:  No  Homicidal Thoughts:  No  Memory:  Immediate;   Fair Recent;   Fair Remote;   Fair  Judgement:  Impaired  Insight:  Fair  Psychomotor Activity:  Normal  Concentration:  Distractible   Recall:  [employer]  Fund of Knowledge:  Fair  Language:  Good  Akathisia:  Negative  Handed:    AIMS (if indicated):     Assets:   Housing Intimacy Physical Health Transportation  ADL's:  Intact  Cognition:  WNL  Sleep:  Number of Hours: 6.75      COGNITIVE FEATURES THAT CONTRIBUTE TO RISK:  Loss of executive function    SUICIDE RISK:   Mild:  Suicidal ideation of limited frequency, intensity, duration, and specificity.  There are no identifiable plans, no associated intent, mild dysphoria and related symptoms, good self-control (both objective and subjective assessment), few other risk factors, and identifiable protective factors, including available and accessible social support.  PLAN OF CARE:  1. Suicide precautions 2. Continue Lithium and Olanzapine at current dose  I certify that inpatient services furnished can reasonably be expected to improve the patient's condition.   Georgiann CockerVincent A Noralee Dutko, MD 11/16/2016, 6:01 PM

## 2016-11-16 NOTE — Progress Notes (Signed)
Adult Psychoeducational Group Note  Date:  11/16/2016 Time:  10:39 PM  Group Topic/Focus:  Wrap-Up Group:   The focus of this group is to help patients review their daily goal of treatment and discuss progress on daily workbooks.  Participation Level:  Active  Participation Quality:  Appropriate  Affect:  Appropriate  Cognitive:  Alert  Insight: Appropriate  Engagement in Group:  Engaged  Modes of Intervention:  Discussion  Additional Comments:  Patient rated his day a 9.99. Patient's goal for today was to relax and have a good day.   Ronald Fritz 11/16/2016, 10:39 PM
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
## 2016-11-17 NOTE — Progress Notes (Signed)
Adult Psychoeducational Group Note  Date:  11/17/2016 Time:  10:56 PM  Group Topic/Focus:  Wrap-Up Group:   The focus of this group is to help patients review their daily goal of treatment and discuss progress on daily workbooks.  Participation Level:  Active  Participation Quality:  Appropriate  Affect:  Appropriate  Cognitive:  Oriented  Insight: Appropriate  Engagement in Group:  Engaged  Modes of Intervention:  Socialization and Support  Additional Comments:  Patient attended and participated in group tonight. He reports having a great day. He went meals and attended groups. He socialized in the day room. He had visitors and the visit went well.  Lita MainsFrancis, Twyla Dais [facility] 11/17/2016, 10:56 PM

## 2016-11-17 NOTE — Progress Notes (Signed)
D: Pt at the time of assessment was flat and withdrawn to room. Pt at this the denied SI, HI, depression, anxiety or AVH; states, "I shouldn't even be her; I am just fine." Pt observed interacting appropriately with peer. Pt calm and pleasant.  A: Medications offered as prescribed. All patient's questions and concerns addressed. Support, encouragement, and safe environment provided. 15-minute safety checks continue. R: Pt was med compliant. Pt did not attend wrap-up group. Safety checks continue.
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
## 2016-11-17 NOTE — [facility] Group Notes (Signed)
[facility] Group Notes:  (Clinical Social Work)  11/17/2016  11:00AM-12:00PM  Summary of Progress/Problems:  The main focus of today's process group was to listen to a variety of genres of music and to identify that different types of music provoke different responses.  The patient then was able to identify personally what was soothing for them, as well as energizing, as well as how patient can personally use this knowledge in sleep habits, with depression, and with other symptoms.  The patient expressed at the beginning of group the overall feeling of "alright" although he reported he is missing his wife and children.  He enjoyed the music and talked about this throughout group.  He danced and sang, and seemed quite happy throughout group.  Type of Therapy:  Music Therapy   Participation Level:  Active  Participation Quality:  Attentive and Sharing  Affect:  Blunted  Cognitive:  Oriented  Insight:  Engaged  Engagement in Therapy:  Engaged  Modes of Intervention:   Activity, Exploration  Ambrose MantleMareida Grossman-Orr, LCSW 11/17/2016

## 2016-11-17 NOTE — Progress Notes (Signed)
Patient ID: Ronald BushDavid Fritz, male   DOB: 12-18-1980, 36 y.o.   MRN: [id number]    D: Pt has been very flat and depressed on the unit today. Pt goes to group but has minimal participation. Pt reported that his depression was a 0, his hopelessness was a 0, and his anxiety was a 0. Pt reported that his goal for today was to love life. Pt has taken all medications as prescribed by the doctor. Pt reported being negative SI/HI, no AH/VH noted. A: 15 min checks continued for patient safety. R: Pt safety maintained.

## 2016-11-17 NOTE — [facility] Suicide Risk Assessment (Signed)
[facility] INPATIENT:  Family/Significant Other Suicide Prevention Education  Suicide Prevention Education:  Education Completed; Pt's mother Ronald Fritz at [phone] has been identified by the patient as the family member/significant other with whom the patient will be residing, and identified as the person(s) who will aid the patient in the event of a mental health crisis (suicidal ideations/suicide attempt).  With written consent from the patient, the family member/significant other has been provided the following suicide prevention education, prior to the and/or following the discharge of the patient.  The suicide prevention education provided includes the following:  Suicide risk factors  Suicide prevention and interventions  National Suicide Hotline telephone number  [facility] assessment telephone number  [facility] Emergency Assistance 911  [facility] and/or Residential Mobile Crisis Unit telephone number  Request made of family/significant other to:  Remove weapons (e.g., guns, rifles, knives), all items previously/currently identified as safety concern.    Remove drugs/medications (over-the-counter, prescriptions, illicit drugs), all items previously/currently identified as a safety concern.  The family member/significant other verbalizes understanding of the suicide prevention education information provided.  The family member/significant other agrees to remove the items of safety concern listed above.  Ronald Fritz 11/17/2016, 12:45 PM

## 2016-11-17 NOTE — Progress Notes (Signed)
[facility] MD Progress Note  11/17/2016 3:38 PM Ronald Fritz  MRN:  [id number] Subjective:  Ronald Fritz reports " I am feeling great, I just miss my family, they keep running away from me."  Objective: Ronald Fritz is awake, alert and oriented. Seen pacing the unit.  Denies suicidal or homicidal ideation during this assessment. Denies auditory or visual hallucination and does not appear to be responding to internal stimuli. Mild thought blocking noted during this assessment. Patient reports he is medication compliant without mediation side effects. States his depression 5/10, states his depression is related to making his family unhappy. Patient reports he is hopeful for a family visit tonight. Support, encouragement and reassurance was provided.   Principal Problem: Schizoaffective disorder, bipolar type (HCC) Diagnosis:   Patient Active Problem List   Diagnosis Date Noted  . Schizoaffective disorder, bipolar type (HCC) [F25.0] 11/16/2016  . 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 Bipolar disord, crnt episode mixed, severe, w psych features (HCC) [F31.64] 11/12/2016  . Acute cholecystitis [K81.0] 10/03/2015   Total Time spent with patient: 30 minutes  Past Psychiatric History:   Past Medical History: History reviewed. No pertinent past medical history.  Past Surgical History:  Procedure Laterality Date  . CHOLECYSTECTOMY N/A 10/04/2015   Procedure: LAPAROSCOPIC CHOLECYSTECTOMY;  Surgeon: Axel Filler, MD;  Location: [facility] OR;  Service: General;  Laterality: N/A;   Family History: History reviewed. No pertinent family history. Family Psychiatric  History:  Social History:  History  Alcohol Use No     History  Drug Use No    Social History   Social History  . Marital status: Married    Spouse name: N/A  . Number of children: N/A  . Years of education: N/A   Social History Main Topics  . Smoking status: Never Smoker  . Smokeless tobacco: Never Used  . Alcohol use No  .  Drug use: No  . Sexual activity: Yes    Birth control/ protection: None   Other Topics Concern  . None   Social History Narrative  . None   Additional Social History:    Pain Medications: See PTA medication list Prescriptions: See PTA medication list Over the Counter: See PTA medication list History of alcohol / drug use?: Yes Name of Substance 1: Marijuana 1 - Age of First Use: Teens 1 - Amount (size/oz): Varies 1 - Frequency: Patient stated rare use 1 - Duration: off and on 1 - Last Use / Amount: Week & a half ago                  Sleep: Fair  Appetite:  Fair  Current Medications: Current Facility-Administered Medications  Medication Dose Route Frequency Provider Last Rate Last Dose  . acetaminophen (TYLENOL) tablet 650 mg  650 mg Oral Q4H PRN Laveda Abbe, NP      . alum & mag hydroxide-simeth (MAALOX/MYLANTA) 200-200-20 MG/5ML suspension 30 mL  30 mL Oral Q4H PRN Laveda Abbe, NP      . lithium carbonate (ESKALITH) CR tablet 450 mg  450 mg Oral BID Laveda Abbe, NP   450 mg at 11/17/16 1525  . magnesium hydroxide (MILK OF MAGNESIA) suspension 30 mL  30 mL Oral Daily PRN Laveda Abbe, NP      . OLANZapine [facility]) tablet 10 mg  10 mg Oral BID Laveda Abbe, NP   10 mg at 11/17/16 1524  . ondansetron (ZOFRAN) tablet 4 mg  4 mg Oral Q8H PRN  Laveda Abbe, NP      . traZODone (DESYREL) tablet 100 mg  100 mg Oral QHS Laveda Abbe, NP   100 mg at 11/16/16 2115    Lab Results: No results found for this or any previous visit (from the past 48 hour(s)).  Blood Alcohol level:  Lab Results  Component Value Date   ETH <5 11/14/2016   ETH <5 11/11/2016    Metabolic Disorder Labs: No results found for: HGBA1C, MPG No results found for: PROLACTIN No results found for: CHOL, TRIG, HDL, CHOLHDL, VLDL, LDLCALC  Physical Findings: AIMS: Facial and Oral Movements Muscles of Facial Expression: None,  normal Lips and Perioral Area: None, normal Jaw: None, normal Tongue: None, normal,Extremity Movements Upper (arms, wrists, hands, fingers): None, normal Lower (legs, knees, ankles, toes): None, normal, Trunk Movements Neck, shoulders, hips: None, normal, Overall Severity Severity of abnormal movements (highest score from questions above): None, normal Incapacitation due to abnormal movements: None, normal Patient's awareness of abnormal movements (rate only patient's report): No Awareness, Dental Status Current problems with teeth and/or dentures?: No Does patient usually wear dentures?: No  CIWA:    COWS:     Musculoskeletal: Strength & Muscle Tone: within normal limits Gait & Station: normal Patient leans: N/A  Psychiatric Specialty Exam: Physical Exam  Vitals reviewed. Constitutional: He is oriented to person, place, and time. He appears well-developed.  Neurological: He is alert and oriented to person, place, and time.  Skin: Skin is warm and dry.  Psychiatric: He has a normal mood and affect. His behavior is normal.    Review of Systems  Psychiatric/Behavioral: Positive for depression. Negative for suicidal ideas. The patient is nervous/anxious.     Blood pressure 133/77, pulse 99, temperature 98.1 F (36.7 C), temperature source Oral, resp. rate 16, height 6\' 3"  (1.905 m), weight 115.2 kg (254 lb), SpO2 99 %.Body mass index is 31.75 kg/m.  General Appearance: Casual and Guarded  Eye Contact:  Fair  Speech:  Clear and Coherent  Volume:  Normal  Mood:  Anxious and Depressed  Affect:  Congruent, Depressed and Flat  Thought Process:  Coherent  Orientation:  Full (Time, Place, and Person)  Thought Content:  Paranoid Ideation and Rumination  Suicidal Thoughts:  No  Homicidal Thoughts:  No  Memory:  Immediate;   Fair Recent;   Fair Remote;   Fair  Judgement:  Fair  Insight:  Fair  Psychomotor Activity:  Normal  Concentration:  Concentration: Fair and Attention Span:  Fair  Recall:  [employer] of Knowledge:  Fair  Language:  Fair  Akathisia:  No  Handed:  Right  AIMS (if indicated):     Assets:  Communication Skills Desire for Improvement Resilience Social Support  ADL's:  Intact  Cognition:  WNL  Sleep:  Number of Hours: 2.25     I agree with current treatment plan on 11/17/2016, Patient seen face-to-face for psychiatric evaluation follow-up, chart reviewed. Reviewed the information documented and agree with the treatment plan.  Treatment Plan Summary: Daily contact with patient to assess and evaluate symptoms and progress in treatment and Medication management   Continue with Zyprexa 10 mg , Lithium 450 mgs for mood stabilization. Continue with Trazodone 100 mg for insomnia  Will continue to monitor vitals ,medication compliance and treatment side effects while patient is here.  Reviewed labs: Lithium level 0.48 ,BAL - , UDS - pos for Thc  CSW will start working on disposition.  Patient to participate in  therapeutic milieu  Oneta Rackanika N Lewis, NP 11/17/2016, 3:38 PM

## 2016-11-18 DIAGNOSIS — F3164 Bipolar disorder, current episode mixed, severe, with psychotic features: Secondary | ICD-10-CM

## 2016-11-18 MED ORDER — OLANZAPINE 10 MG PO TABS
10.0000 mg | ORAL_TABLET | ORAL | Status: DC
  Administered 2016-11-18 – 2016-11-20 (×4): 10 mg via ORAL
  Filled 2016-11-18 (×6): qty 1

## 2016-11-18 MED ORDER — OLANZAPINE 5 MG PO TABS
5.0000 mg | ORAL_TABLET | Freq: Two times a day (BID) | ORAL | Status: DC | PRN
  Administered 2016-11-19: 5 mg via ORAL
  Filled 2016-11-18: qty 2

## 2016-11-18 MED ORDER — TRAZODONE HCL 100 MG PO TABS
125.0000 mg | ORAL_TABLET | Freq: Every day | ORAL | Status: DC
  Administered 2016-11-18 – 2016-11-19 (×2): 125 mg via ORAL
  Filled 2016-11-18 (×3): qty 1

## 2016-11-18 MED ORDER — OLANZAPINE 10 MG IM SOLR: 5.0000 mg | Freq: Two times a day (BID) | INTRAMUSCULAR | Status: DC | PRN

## 2016-11-18 NOTE — Progress Notes (Signed)
Adult Psychoeducational Group Note  Date:  11/18/2016 Time:  9:53 PM  Group Topic/Focus:  Wrap-Up Group:   The focus of this group is to help patients review their daily goal of treatment and discuss progress on daily workbooks.  Participation Level:  Minimal  Participation Quality:  Appropriate  Affect:  Appropriate  Cognitive:  Appropriate  Insight: Appropriate  Engagement in Group:  Engaged  Modes of Intervention:  Socialization and Support  Additional Comments:  Patient attended and participated in group tonight. He reports having a great day. He had a visitor today. The visit was very good. He went for his meals and attended groups.  Lita MainsFrancis, Brenn Deziel [facility] 11/18/2016, 9:53 PM

## 2016-11-18 NOTE — [facility] Group Notes (Signed)
[facility] LCSW Group Therapy  11/18/2016 1:15 pm  Type of Therapy: Process Group Therapy  Participation Level:  Active  Participation Quality:  Appropriate  Affect:  Flat  Cognitive:  Oriented  Insight:  Improving  Engagement in Group:  Limited  Engagement in Therapy:  Limited  Modes of Intervention:  Activity, Clarification, Education, Problem-solving and Support  Summary of Progress/Problems: Today's group addressed the issue of overcoming obstacles.  Patients were asked to identify their biggest obstacle post d/c that stands in the way of their on-going success, and then problem solve as to how to manage this. Stayed the entire time, engaged throughout.  Appears to have limited insight-or does not want others to have information about him.  Religious overtones in all conversations.  Vague responses to all questions.  "My biggest obstacle is myself. I am sometimes dishonest.  But there is forgiveness through God.  I want everyone to experience God's peace."  Ida Rogueorth, Naliya Gish B 11/18/2016   3:43 PM

## 2016-11-18 NOTE — Progress Notes (Signed)
[facility] MD Progress Note  11/18/2016 3:31 PM Ronald Fritz  MRN:  [id number] Subjective:  Patient states " I am ok.'   Objective:  Patient seen and chart reviewed.Discussed patient with treatment team.  Pt today seen as guarded, appears to answer questions in short phrases. Pt per staff has been compliant with medications. Pt has bee attending groups , but limited participation overall. Tolerating medications well, denies ADRs.  Reviewed collateral information obtained from mother - hx of bipolar do in father , at the same age . Mother is supportive.     Principal Problem: Bipolar disord, crnt episode mixed, severe, w psych features [facility]) Diagnosis:   Patient Active Problem List   Diagnosis Date Noted  . 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 Bipolar disord, crnt episode mixed, severe, w psych features (HCC) [F31.64] 11/12/2016  . Acute cholecystitis [K81.0] 10/03/2015   Total Time spent with patient: 20 minutes  Past Psychiatric History: Please see H&P.   Past Medical History: Please see H&P.  Past Surgical History:  Procedure Laterality Date  . CHOLECYSTECTOMY N/A 10/04/2015   Procedure: LAPAROSCOPIC CHOLECYSTECTOMY;  Surgeon: Axel Filler, MD;  Location: [facility] OR;  Service: General;  Laterality: N/A;   Family History: Please see H&P.  Family Psychiatric  History: Please see H&P.  Social History: Please see H&P.  History  Alcohol Use No     History  Drug Use No    Social History   Social History  . Marital status: Married    Spouse name: N/A  . Number of children: N/A  . Years of education: N/A   Social History Main Topics  . Smoking status: Never Smoker  . Smokeless tobacco: Never Used  . Alcohol use No  . Drug use: No  . Sexual activity: Yes    Birth control/ protection: None   Other Topics Concern  . None   Social History Narrative  . None   Additional Social History:    Pain Medications: See PTA medication list Prescriptions:  See PTA medication list Over the Counter: See PTA medication list History of alcohol / drug use?: Yes Name of Substance 1: Marijuana 1 - Age of First Use: Teens 1 - Amount (size/oz): Varies 1 - Frequency: Patient stated rare use 1 - Duration: off and on 1 - Last Use / Amount: Week & a half ago                  Sleep: Fair  Appetite:  Fair  Current Medications: Current Facility-Administered Medications  Medication Dose Route Frequency Provider Last Rate Last Dose  . acetaminophen (TYLENOL) tablet 650 mg  650 mg Oral Q4H PRN Laveda Abbe, NP      . alum & mag hydroxide-simeth (MAALOX/MYLANTA) 200-200-20 MG/5ML suspension 30 mL  30 mL Oral Q4H PRN Laveda Abbe, NP      . lithium carbonate (ESKALITH) CR tablet 450 mg  450 mg Oral BID Laveda Abbe, NP   450 mg at 11/18/16 0816  . magnesium hydroxide (MILK OF MAGNESIA) suspension 30 mL  30 mL Oral Daily PRN Laveda Abbe, NP      . OLANZapine [facility]) tablet 10 mg  10 mg Oral BID Laveda Abbe, NP   10 mg at 11/18/16 0816  . ondansetron (ZOFRAN) tablet 4 mg  4 mg Oral Q8H PRN Laveda Abbe, NP      . traZODone (DESYREL) tablet 125 mg  125 mg Oral QHS Jomarie Longs, MD  Lab Results: No results found for this or any previous visit (from the past 48 hour(s)).  Blood Alcohol level:  Lab Results  Component Value Date   ETH <5 11/14/2016   ETH <5 11/11/2016    Metabolic Disorder Labs: No results found for: HGBA1C, MPG No results found for: PROLACTIN No results found for: CHOL, TRIG, HDL, CHOLHDL, VLDL, LDLCALC  Physical Findings: AIMS: Facial and Oral Movements Muscles of Facial Expression: None, normal Lips and Perioral Area: None, normal Jaw: None, normal Tongue: None, normal,Extremity Movements Upper (arms, wrists, hands, fingers): None, normal Lower (legs, knees, ankles, toes): None, normal, Trunk Movements Neck, shoulders, hips: None, normal, Overall  Severity Severity of abnormal movements (highest score from questions above): None, normal Incapacitation due to abnormal movements: None, normal Patient's awareness of abnormal movements (rate only patient's report): No Awareness, Dental Status Current problems with teeth and/or dentures?: No Does patient usually wear dentures?: No  CIWA:    COWS:     Musculoskeletal: Strength & Muscle Tone: within normal limits Gait & Station: normal Patient leans: N/A  Psychiatric Specialty Exam: Physical Exam  Nursing note and vitals reviewed. Constitutional: He appears well-developed.    Review of Systems  Psychiatric/Behavioral: Positive for depression and substance abuse. The patient is nervous/anxious.   All other systems reviewed and are negative.   Blood pressure 125/69, pulse 74, temperature 97.7 F (36.5 C), temperature source Oral, resp. rate 18, height 6\' 3"  (1.905 m), weight 115.2 kg (254 lb), SpO2 99 %.Body mass index is 31.75 kg/m.  General Appearance: Casual and Guarded  Eye Contact:  Fair  Speech:  Clear and Coherent  Volume:  Normal  Mood:  Anxious and Depressed  Affect:  Congruent  Thought Process:  Goal Directed and Descriptions of Associations: Circumstantial  Orientation:  Full (Time, Place, and Person)  Thought Content:  Paranoid Ideation  Suicidal Thoughts:  No  Homicidal Thoughts:  No  Memory:  Immediate;   Fair Recent;   Fair Remote;   Fair  Judgement:  Fair  Insight:  Fair  Psychomotor Activity:  Normal  Concentration:  Concentration: Fair and Attention Span: Fair  Recall:  [employer]  Fund of Knowledge:  Fair  Language:  Fair  Akathisia:  No  Handed:  Right  AIMS (if indicated):     Assets:  Communication Skills Desire for Improvement  ADL's:  Intact  Cognition:  WNL  Sleep:  Number of Hours: 2.25   Bipolar disord, crnt episode mixed, severe, w psych features (HCC) unstable  Will continue today 11/18/16  plan as below except where it is noted.      Treatment Plan Summary:Patient with mood sx, psychosis , bizarre behavior , hx of not responding to medications that was started prior to admissions , is making partial progress , continue treatment. Daily contact with patient to assess and evaluate symptoms and progress in treatment and Medication management   Continue Li 450 mg po bid for mood lability. Li level on 11/20/16. Will increase Zyprexa 10 mg po bid for psychosis. Increase Trazodone to 125 mg po qhs for insomnia . Sleep noted as poor last night. Will order tsh, lipid panel, hba1c, pl. Will get EKG for qtc monitoring. CSW will continue to work on disposition.     Jakylan Ron, MD 11/18/2016, 3:31 PM

## 2016-11-18 NOTE — Tx Team (Signed)
Interdisciplinary Treatment and Diagnostic Plan Update  11/18/2016 Time of Session: 8:43 AM  Ronald Fritz MRN: [id number]  Principal Diagnosis: Schizoaffective disorder, bipolar type [facility])  Secondary Diagnoses: Principal Problem:   Schizoaffective disorder, bipolar type ([place])   Current Medications:  Current Facility-Administered Medications  Medication Dose Route Frequency Provider Last Rate Last Dose  . acetaminophen (TYLENOL) tablet 650 mg  650 mg Oral Q4H PRN Ethelene Hal, NP      . alum & mag hydroxide-simeth (MAALOX/MYLANTA) 200-200-20 MG/5ML suspension 30 mL  30 mL Oral Q4H PRN Ethelene Hal, NP      . lithium carbonate (ESKALITH) CR tablet 450 mg  450 mg Oral BID Ethelene Hal, NP   450 mg at 11/18/16 0816  . magnesium hydroxide (MILK OF MAGNESIA) suspension 30 mL  30 mL Oral Daily PRN Ethelene Hal, NP      . OLANZapine [facility]) tablet 10 mg  10 mg Oral BID Ethelene Hal, NP   10 mg at 11/18/16 0816  . ondansetron (ZOFRAN) tablet 4 mg  4 mg Oral Q8H PRN Ethelene Hal, NP      . traZODone (DESYREL) tablet 100 mg  100 mg Oral QHS Ethelene Hal, NP   100 mg at 11/17/16 2117    PTA Medications: Prescriptions Prior to Admission  Medication Sig Dispense Refill Last Dose  . lithium carbonate (ESKALITH) 450 MG CR tablet Take 450 mg by mouth 2 (two) times daily.  0 11/13/2016 at Unknown time  . OLANZapine (ZYPREXA) 5 MG tablet Take 5 mg by mouth 2 (two) times daily.    11/13/2016 at Unknown time  . traZODone (DESYREL) 50 MG tablet Take 50 mg by mouth at bedtime.  0 11/13/2016 at Unknown time    Patient Stressors: Financial difficulties Medication change or noncompliance  Patient Strengths: Curator fund of knowledge Physical Health Supportive family/friends Work skills  Treatment Modalities: Medication Management, Group therapy, Case management,  1 to 1 session with clinician, Psychoeducation,  Recreational therapy.   Physician Treatment Plan for Primary Diagnosis: Schizoaffective disorder, bipolar type ([place]) Long Term Goal(s): Improvement in symptoms so as ready for discharge  Short Term Goals: Ability to verbalize feelings will improve Ability to demonstrate self-control will improve Ability to identify and develop effective coping behaviors will improve Compliance with prescribed medications will improve Ability to identify changes in lifestyle to reduce recurrence of condition will improve Ability to verbalize feelings will improve Compliance with prescribed medications will improve Ability to identify triggers associated with substance abuse/mental health issues will improve  Medication Management: Evaluate patient's response, side effects, and tolerance of medication regimen.  Therapeutic Interventions: 1 to 1 sessions, Unit Group sessions and Medication administration.  Evaluation of Outcomes: Progressing  Physician Treatment Plan for Secondary Diagnosis: Principal Problem:   Schizoaffective disorder, bipolar type ([place])   Long Term Goal(s): Improvement in symptoms so as ready for discharge  Short Term Goals: Ability to verbalize feelings will improve Ability to demonstrate self-control will improve Ability to identify and develop effective coping behaviors will improve Compliance with prescribed medications will improve Ability to identify changes in lifestyle to reduce recurrence of condition will improve Ability to verbalize feelings will improve Compliance with prescribed medications will improve Ability to identify triggers associated with substance abuse/mental health issues will improve  Medication Management: Evaluate patient's response, side effects, and tolerance of medication regimen.  Therapeutic Interventions: 1 to 1 sessions, Unit Group sessions and Medication administration.  Evaluation of Outcomes:  Progressing   RN Treatment Plan for Primary  Diagnosis: Schizoaffective disorder, bipolar type ([place]) Long Term Goal(s): Knowledge of disease and therapeutic regimen to maintain health will improve  Short Term Goals: Ability to identify and develop effective coping behaviors will improve and Compliance with prescribed medications will improve  Medication Management: RN will administer medications as ordered by provider, will assess and evaluate patient's response and provide education to patient for prescribed medication. RN will report any adverse and/or side effects to prescribing provider.  Therapeutic Interventions: 1 on 1 counseling sessions, Psychoeducation, Medication administration, Evaluate responses to treatment, Monitor vital signs and CBGs as ordered, Perform/monitor CIWA, COWS, AIMS and Fall Risk screenings as ordered, Perform wound care treatments as ordered.  Evaluation of Outcomes: Progressing   LCSW Treatment Plan for Primary Diagnosis: Schizoaffective disorder, bipolar type ([place]) Long Term Goal(s): Safe transition to appropriate next level of care at discharge, Engage patient in therapeutic group addressing interpersonal concerns.  Short Term Goals: Engage patient in aftercare planning with referrals and resources  Therapeutic Interventions: Assess for all discharge needs, 1 to 1 time with Social worker, Explore available resources and support systems, Assess for adequacy in community support network, Educate family and significant other(s) on suicide prevention, Complete Psychosocial Assessment, Interpersonal group therapy.  Evaluation of Outcomes: Met  Return home [after staying with mother temporarily] and follow up [place] in Treatment: Attending groups: Yes Participating in groups: Yes Taking medication as prescribed: Yes Toleration medication: Yes, no side effects reported at this time Family/Significant other contact made: Yes Patient understands diagnosis: No  Limited  insight Discussing patient identified problems/goals with staff: Yes Medical problems stabilized or resolved: Yes Denies suicidal/homicidal ideation: Yes Issues/concerns per patient self-inventory: None Other: N/A  New problem(s) identified: None identified at this time.   New Short Term/Long Term Goal(s): "I want to help as many people as possible while I am here."   Discharge Plan or Barriers:   Reason for Continuation of Hospitalization: Mood Instability [facility] Medication stabilization   Estimated Length of Stay: 2-4 days  Attendees: Patient: Ronald Fritz 11/18/2016  8:43 AM  Physician: Ursula Alert, MD 11/18/2016  8:43 AM  Nursing: Sena Hitch, RN 11/18/2016  8:43 AM  RN Care Manager: Lars Pinks, RN 11/18/2016  8:43 AM  Social Worker: Ripley Fraise 11/18/2016  8:43 AM  Recreational Therapist: Winfield Cunas 11/18/2016  8:43 AM  Other: Norberto Sorenson 11/18/2016  8:43 AM  Other:  11/18/2016  8:43 AM    Scribe for Treatment Team:  Roque Lias LCSW 11/18/2016 8:43 AM

## 2016-11-18 NOTE — Progress Notes (Signed)
D: Pt at the time of assessment was flat and restless. Pt at this the denied SI, HI, depression, anxiety or AVH; states, "I am as calm and cool as I can be." Pt observed interacting with peers and staffs. Pt also observed dancing inappropriately; states, "Life is good" A: Medications offered as prescribed. All patient's questions and concerns addressed. Support, encouragement, and safe environment provided. 15-minute safety checks continue. R: Pt was med compliant. Pt did not attend wrap-up group. Safety checks continue.

## 2016-11-18 NOTE — Progress Notes (Signed)
Recreation Therapy Notes  Date: 11/18/16 Time: 1000 Location: 500 Hall Dayroom  Group Topic: Coping Skills  Goal Area(s) Addresses:  Patient will be able to identify positive coping skills. Patient will be able to identify benefits of coping skills. Patient will be ale to identify benefits of using coping skills post d/c.  Behavioral Response: Engaged  Intervention: Pencils, mind map worksheet  Activity: Coping Skills.  Patients were given a blank mind map,  LRT and patients filled in the first eight boxes together.  The trigger patients selected for the mind map were budget, anxiety, traumatic events, paying bills, not going back to old habits, greed, lust and eating healthy.  Education:Coping Skills, Discharge Planning.   Education Outcome: Acknowledges understanding/In group clarification offered/Needs additional education.   Clinical Observations/Feedback: Pt was engaged and active.  Pt stated being with his wife and children were good coping skills for him.  Pt identified some of his coping skills as prayer, taking medication for anxiety, appreciate what you have for greed, communication for lust, positive reinforcement for not going back to old habits and stable income for paying bills.    Caroll RancherMarjette Ezra Denne, LRT/CTRS        Caroll RancherLindsay, Rodney Yera A 11/18/2016 2:52 PM

## 2016-11-18 NOTE — Progress Notes (Signed)
Patient is awake and up at the front doors of the hall. Patient is knocking on the window and states "I'm just ready to roll out of here when you are". Patient instructed to return to room until it is time to wake up. Patient remains at nurses station.

## 2016-11-19 LAB — TSH: TSH: 1.807 u[IU]/mL (ref 0.350–4.500)

## 2016-11-19 LAB — LIPID PANEL
Cholesterol: 111 mg/dL (ref 0–200)
HDL: 36 mg/dL — AB (ref >40)
LDL Cholesterol: 52 mg/dL (ref 0–99)
TRIGLYCERIDES: 117 mg/dL (ref <150)
Total CHOL/HDL Ratio: 3.1 RATIO
VLDL: 23 mg/dL (ref 0–40)

## 2016-11-19 MED ORDER — BENZTROPINE MESYLATE 0.5 MG PO TABS
0.5000 mg | ORAL_TABLET | ORAL | Status: DC
  Administered 2016-11-19 – 2016-11-20 (×2): 0.5 mg via ORAL
  Filled 2016-11-19 (×4): qty 1

## 2016-11-19 NOTE — Progress Notes (Signed)
Adult Psychoeducational Group Note  Date:  11/19/2016 Time:  8:59 PM  Group Topic/Focus:  Wrap-Up Group:   The focus of this group is to help patients review their daily goal of treatment and discuss progress on daily workbooks.  Participation Level:  Minimal  Participation Quality:  Attentive  Affect:  Blunted  Cognitive:  Appropriate  Insight: Appropriate  Engagement in Group:  Engaged  Modes of Intervention:  Discussion  Additional Comments:  Pt stated his goal was to love life which he was able to accomplish Pt was encouraged to make her needs known to staff.  Caswell CorwinOwen, Mckade Gurka C 11/19/2016, 8:59 PM

## 2016-11-19 NOTE — Progress Notes (Signed)
[facility] MD Progress Note  11/19/2016 2:45 PM Ronald Fritz  MRN:  [id number] Subjective:  Patient states " I am better.'   Objective:  Patient seen and chart reviewed.Discussed patient with treatment team.  Pt today seen as anxious , appears to be calm, cooperative . Pt reports sleep as improved . Reports he wants to make it to his appointment tomorrow at his outpatient clinic.  Per RN , pt seen as responding to self at times , however is making progress , is seen as attending groups , denies any ADRs to medications.  Per RN , mother Ronald Fritz [phone] wanted to talk writer - hence called her along with CSW - Mother discussed her observation of her son's behavior when she visited last night , felt he was pacing the hallways which was different from Sunday. However she is happy that he is sleeping more and wanted to take him to his outpatient appointment if possible . Also discussed medical leave form that needed to be filled out.mother reassured , possible discharge tomorrow.    Principal Problem: Bipolar disord, crnt episode mixed, severe, w psych features [facility]) Diagnosis:   Patient Active Problem List   Diagnosis Date Noted  . 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 Bipolar disord, crnt episode mixed, severe, w psych features (HCC) [F31.64] 11/12/2016  . Acute cholecystitis [K81.0] 10/03/2015   Total Time spent with patient: 30 minutes  Past Psychiatric History: Please see H&P.   Past Medical History: Please see H&P.  Past Surgical History:  Procedure Laterality Date  . CHOLECYSTECTOMY N/A 10/04/2015   Procedure: LAPAROSCOPIC CHOLECYSTECTOMY;  Surgeon: Axel Filler, MD;  Location: [facility] OR;  Service: General;  Laterality: N/A;   Family History: Please see H&P.  Family Psychiatric  History: Please see H&P.  Social History: Please see H&P.  History  Alcohol Use No     History  Drug Use No    Social History   Social History  . Marital status:  Married    Spouse name: N/A  . Number of children: N/A  . Years of education: N/A   Social History Main Topics  . Smoking status: Never Smoker  . Smokeless tobacco: Never Used  . Alcohol use No  . Drug use: No  . Sexual activity: Yes    Birth control/ protection: None   Other Topics Concern  . None   Social History Narrative  . None   Additional Social History:    Pain Medications: See PTA medication list Prescriptions: See PTA medication list Over the Counter: See PTA medication list History of alcohol / drug use?: Yes Name of Substance 1: Marijuana 1 - Age of First Use: Teens 1 - Amount (size/oz): Varies 1 - Frequency: Patient stated rare use 1 - Duration: off and on 1 - Last Use / Amount: Week & a half ago                  Sleep: Fair  Appetite:  Fair  Current Medications: Current Facility-Administered Medications  Medication Dose Route Frequency Provider Last Rate Last Dose  . acetaminophen (TYLENOL) tablet 650 mg  650 mg Oral Q4H PRN Laveda Abbe, NP      . alum & mag hydroxide-simeth (MAALOX/MYLANTA) 200-200-20 MG/5ML suspension 30 mL  30 mL Oral Q4H PRN Laveda Abbe, NP      . lithium carbonate (ESKALITH) CR tablet 450 mg  450 mg Oral BID Laveda Abbe, NP   450 mg at 11/19/16  1610  . magnesium hydroxide (MILK OF MAGNESIA) suspension 30 mL  30 mL Oral Daily PRN Laveda Abbe, NP      . OLANZapine [facility]) tablet 5 mg  5 mg Oral BID PRN Jomarie Longs, MD       Or  . OLANZapine (ZYPREXA) injection 5 mg  5 mg Intramuscular BID PRN Jenavi Beedle, MD      . OLANZapine (ZYPREXA) tablet 10 mg  10 mg Oral Deidre Ala, MD   10 mg at 11/19/16 0814  . ondansetron (ZOFRAN) tablet 4 mg  4 mg Oral Q8H PRN Laveda Abbe, NP      . traZODone (DESYREL) tablet 125 mg  125 mg Oral QHS Jomarie Longs, MD   125 mg at 11/18/16 2135    Lab Results:  Results for orders placed or performed during the hospital  encounter of 11/15/16 (from the past 48 hour(s))  TSH     Status: None   Collection Time: 11/19/16  6:14 AM  Result Value Ref Range   TSH 1.807 0.350 - 4.500 uIU/mL    Comment: Performed by a 3rd Generation assay with a functional sensitivity of <=0.01 uIU/mL. Performed at [facility], [address] [place], [place] [address]   Lipid panel     Status: Abnormal   Collection Time: 11/19/16  6:14 AM  Result Value Ref Range   Cholesterol 111 0 - 200 mg/dL   Triglycerides 409 <811 mg/dL   HDL 36 (L) >91 mg/dL   Total CHOL/HDL Ratio 3.1 RATIO   VLDL 23 0 - 40 mg/dL   LDL Cholesterol 52 0 - 99 mg/dL    Comment:        Total Cholesterol/HDL:CHD Risk Coronary Heart Disease Risk Table                     Men   Women  1/2 Average Risk   3.4   3.3  Average Risk       5.0   4.4  2 X Average Risk   9.6   7.1  3 X Average Risk  23.4   11.0        Use the calculated Patient Ratio above and the CHD Risk Table to determine the patient's CHD Risk.        ATP III CLASSIFICATION (LDL):  <100     mg/dL   Optimal  478-295  mg/dL   Near or Above                    Optimal  130-159  mg/dL   Borderline  621-308  mg/dL   High  >657     mg/dL   Very High Performed at [facility], [address], [place], [place] [address]     Blood Alcohol level:  Lab Results  Component Value Date   ETH <5 11/14/2016   ETH <5 11/11/2016    Metabolic Disorder Labs: No results found for: HGBA1C, MPG No results found for: PROLACTIN Lab Results  Component Value Date   CHOL 111 11/19/2016   TRIG 117 11/19/2016   HDL 36 (L) 11/19/2016   CHOLHDL 3.1 11/19/2016   VLDL 23 11/19/2016   LDLCALC 52 11/19/2016    Physical Findings: AIMS: Facial and Oral Movements Muscles of Facial Expression: None, normal Lips and Perioral Area: None, normal Jaw: None, normal Tongue: None, normal,Extremity Movements Upper (arms, wrists, hands, fingers): None, normal Lower (legs, knees,  ankles,  toes): None, normal, Trunk Movements Neck, shoulders, hips: None, normal, Overall Severity Severity of abnormal movements (highest score from questions above): None, normal Incapacitation due to abnormal movements: None, normal Patient's awareness of abnormal movements (rate only patient's report): No Awareness, Dental Status Current problems with teeth and/or dentures?: No Does patient usually wear dentures?: No  CIWA:    COWS:     Musculoskeletal: Strength & Muscle Tone: within normal limits Gait & Station: normal Patient leans: N/A  Psychiatric Specialty Exam: Physical Exam  Nursing note and vitals reviewed.   Review of Systems  Psychiatric/Behavioral: The patient is nervous/anxious.   All other systems reviewed and are negative.   Blood pressure 127/73, pulse 90, temperature 98.6 F (37 C), temperature source Oral, resp. rate 18, height 6\' 3"  (1.905 m), weight 115.2 kg (254 lb), SpO2 99 %.Body mass index is 31.75 kg/m.  General Appearance: Casual and Guarded  Eye Contact:  Fair  Speech:  Clear and Coherent  Volume:  Normal  Mood:  Anxious and Depressed, improving  Affect:  Congruent  Thought Process:  Goal Directed and Descriptions of Associations: Circumstantial  Orientation:  Full (Time, Place, and Person)  Thought Content:  Paranoid Ideation, improving  Suicidal Thoughts:  No  Homicidal Thoughts:  No  Memory:  Immediate;   Fair Recent;   Fair Remote;   Fair  Judgement:  Fair  Insight:  Fair  Psychomotor Activity:  Normal  Concentration:  Concentration: Fair and Attention Span: Fair  Recall:  FiservFair  Fund of Knowledge:  Fair  Language:  Fair  Akathisia:  No  Handed:  Right  AIMS (if indicated):     Assets:  Communication Skills Desire for Improvement  ADL's:  Intact  Cognition:  WNL  Sleep:  Number of Hours: 5.5   Bipolar disord, crnt episode mixed, severe, w psych features (HCC) improving  Will continue today 11/19/16  plan as below except where it is  noted.      Treatment Plan Summary:Patient with mood lability and psychosis , is sleeping better , showing progress , Li level scheduled for tomorrow , continue treatment.  Daily contact with patient to assess and evaluate symptoms and progress in treatment and Medication management   Continue Li 450 mg po bid for mood lability. Li level on 11/20/16. Will continue Zyprexa 10 mg po bid for psychosis. Will add Cogentin 0.5 mg po bid for EPS. Trazodone  125 mg po qhs for insomnia . TSH - WNL, lipid panel - wnl  EKG reviewed - qtc - wnl. CSW will continue to work on disposition.     Orville Widmann, MD 11/19/2016, 2:45 PM

## 2016-11-19 NOTE — Progress Notes (Signed)
  [facility] :  Will you be returning to the same living situation after discharge:  Yes,  home At discharge, do you have transportation home?: Yes,  family Do you have the ability to pay for your medications: Yes,  insurance  Release of information consent forms completed and in the chart;  Patient's signature needed at discharge.  Patient to Follow up at: Follow-up Information    [facility] Follow up on 11/20/2016.   Why:  Wednesday at 11:00. Then Friday again.   Contact information: [address] [place] [id number] [phone]           Next level of care provider has access to [facility] Link:no  Safety Planning and Suicide Prevention discussed: Yes,  yes  Have you used any form of tobacco in the last 30 days? (Cigarettes, Smokeless Tobacco, Cigars, and/or Pipes): No  Has patient been referred to the Quitline?: N/A patient is not a smoker  Patient has been referred for addiction treatment: Yes  Ida RogueRodney B Deina Lipsey 11/19/2016, 3:58 PM

## 2016-11-19 NOTE — Progress Notes (Signed)
Recreation Therapy Notes  INPATIENT RECREATION THERAPY ASSESSMENT  Patient Details Name: Ria BushDavid Segundo MRN: [id number] DOB: [birth year] Today's Date: 11/19/2016  Patient Stressors: Other (Comment) (Let others make their own decisions)  Pt stated he was here because he [place].  Coping Skills:   Substance Abuse, Avoidance, Exercise, Art/Dance, Talking, Music, Sports  Personal Challenges: Communication, Expressing Yourself, Self-Esteem/Confidence, Trusting Others  Leisure Interests (2+):  Social - Family, Exercise - Walking, Technical brewerature - CuratorHiking  Awareness of Community Resources:  Yes  Community Resources:  Park, Warehouse managerChurch, Other (Comment) (Pools, shopping center)  Current Use: Yes  Patient Strengths:  Faith; love  Patient Identified Areas of Improvement:  Being overly trusting of people; overestimating people  Current Recreation Participation:  Everyday  Patient Goal for Hospitalization:  "Get self ready to return to family"  [place]  [place]  Current [place] (including self-harm):  No  Current HI:  No  Consent to Intern Participation: N/A   Caroll RancherMarjette Mykeal Carrick, LRT/CTRS  Caroll RancherLindsay, Jaylon Grode A 11/19/2016, 9:19 AM

## 2016-11-19 NOTE — Progress Notes (Signed)
DAR NOTE: Pt present with flat affect and depressed mood in the unit. Pt has been observed in the day room with peers, and at times was seen pacing in the hall way. Pt denies physical pain, took all his meds as scheduled. As per self inventory, pt had a good night sleep, good appetite, normal energy, and good concentration. Pt rate depression at 0, hopeless ness at 0, and anxiety at 0. Pt's safety ensured with 15 minute and environmental checks. Pt currently denies SI/HI and A/V hallucinations. Pt verbally agrees to seek staff if SI/HI or A/VH occurs and to consult with staff before acting on these thoughts. Will continue POC.

## 2016-11-19 NOTE — Progress Notes (Signed)
Recreation Therapy Notes  Patient Eligibility Criteria Checklist & Daily Group note for Rec Tx Intervention  Date: 06.26.2018 Time: 2:45pm [address]: [address]    AAA/T Program Assumption of Risk Form signed by Patient/ or Parent Legal Guardian Yes  Patient is free of allergies or sever asthma Yes  Patient reports no fear of animals Yes  Patient reports no history of cruelty to animals Yes  Patient understands his/her participation is voluntary Yes  Patient washes hands before animal contact Yes  Patient washes hands after animal contact Yes  Behavioral Response: Engaged, Appropriate    Education: Hand Washing, Appropriate Animal Interaction   Education Outcome: Acknowledges education.   Clinical Observations/Feedback: Patient discussed with MD for appropriateness in pet therapy session. Both LRT and MD agree patient is appropriate for participation. Patient offered participation in session and signed necessary consent form without issue. Patient interacted with therapy dog and peers appropriately. Patient asked appropriately questions about therapy dog and his training and shared stories about his pets with group. Patient pleasant and polite to interact with.   Marykay Lexenise L Aviyanna Colbaugh, LRT/CTRS        Abdirizak Richison L 11/19/2016 3:14 PM

## 2016-11-19 NOTE — Progress Notes (Signed)
Recreation Therapy Notes  Date: 11/19/16 Time: 1000 Location: [address] Dayroom  Group Topic: Wellness  Goal Area(s) Addresses:  Patient will define components of whole wellness. Patient will verbalize benefit of whole wellness.  Behavioral Response: Engaged  Intervention: Geophysicist/field seismologistmall beach ball, chairs  Activity: Insurance claims handlereated Soccer.  Patients were seated in a semi circle.  Patients were to get the ball past the silver bar on the floor in order to gain a point.  LRT would at as the goalie and try to prevent patients from scoring the ball.  Patients had to score 30 points in order to win the game.  Education: Wellness, Building control surveyorDischarge Planning.   Education Outcome: Acknowledges education/In group clarification offered/Needs additional education.   Clinical Observations/Feedback: Pt was active and bright during group.  Pt was social and trying different ways to get the ball past LRT.  Pt encouraged peers and supportive of them throughout. Pt left early to meet with doctor and did not return.   Caroll RancherMarjette Cypher Paule, LRT/CTRS         Caroll RancherLindsay, Nox Talent A 11/19/2016 11:55 AM

## 2016-11-19 NOTE — [facility] Group Notes (Signed)
[facility] LCSW Group Therapy  11/19/2016 2:17 PM   Type of Therapy:  Group Therapy  Participation Level:  Active  Participation Quality:  Attentive  Affect:  Appropriate  Cognitive:  Appropriate  Insight:  Improving  Engagement in Therapy:  Engaged  Modes of Intervention:  Clarification, Education, Exploration and Socialization  Summary of Progress/Problems: Today's group focused on resilience.  Stayed the entire time, engaged throughout.  "Whenever I feel despair about what we are collectively doing that hurts us-like polluting the environment at an alarming rate-I turn to my faith. It gives me serenity and peace."  Had good feedback to give others. Active group member. Daryel Geraldorth, Jaziel Bennett B 11/19/2016 , 2:17 PM

## 2016-11-20 LAB — PROLACTIN: PROLACTIN: 60.4 ng/mL — AB (ref 4.0–15.2)

## 2016-11-20 LAB — HEMOGLOBIN A1C
Hgb A1c MFr Bld: 5.2 % (ref 4.8–5.6)
MEAN PLASMA GLUCOSE: 103 mg/dL

## 2016-11-20 LAB — LITHIUM LEVEL: LITHIUM LVL: 0.22 mmol/L — AB (ref 0.60–1.20)

## 2016-11-20 MED ORDER — TRAZODONE HCL 150 MG PO TABS: 125.0000 mg | ORAL_TABLET | Freq: Every day | ORAL | 0 refills | Status: DC

## 2016-11-20 MED ORDER — LITHIUM CARBONATE ER 450 MG PO TBCR: 450.0000 mg | EXTENDED_RELEASE_TABLET | Freq: Two times a day (BID) | ORAL | 0 refills | Status: DC

## 2016-11-20 MED ORDER — OLANZAPINE 10 MG PO TABS: 10.0000 mg | ORAL_TABLET | ORAL | 0 refills | Status: DC

## 2016-11-20 MED ORDER — LITHIUM CARBONATE ER 300 MG PO TBCR
300.0000 mg | EXTENDED_RELEASE_TABLET | Freq: Every day | ORAL | Status: DC
  Filled 2016-11-20: qty 1

## 2016-11-20 MED ORDER — BENZTROPINE MESYLATE 0.5 MG PO TABS: 0.5000 mg | ORAL_TABLET | ORAL | 0 refills | Status: DC

## 2016-11-20 MED ORDER — LITHIUM CARBONATE ER 300 MG PO TBCR: 300.0000 mg | EXTENDED_RELEASE_TABLET | Freq: Every day | ORAL | 0 refills | Status: DC

## 2016-11-20 NOTE — Progress Notes (Signed)
Pt continues to pace the hall. Was given prn zyprexa in addition to his scheduled meds in hopes of helping the him relax. Staff continues to promote rest.

## 2016-11-20 NOTE — Progress Notes (Signed)
   D: When asked about his day pt smiled and said "a good day". Informed the writer that he may be discharged tomorrow. When asked if he felt like he got what he needed out of his day pt stated, "They needed me to be here more than I did". Writer asked who "they" was. Pt stated, "my family". When asked if he was hearing or seeing things that weren't there, pt stated, "no". Also answered "no" when asked about suicidal thoughts. Pt stated, "I don't want to give the impression that I'm depressed or something, I was just manic". Stated, "they needed some time and if nothing else I got a little R&R." Pt has no other questions or concerns.    A:  Support and encouragement was offered. 15 min checks continued for safety.  R: Pt remains safe.

## 2016-11-20 NOTE — Tx Team (Signed)
Interdisciplinary Treatment and Diagnostic Plan Update  11/20/2016 Time of Session: 10:26 AM  Ronald Fritz MRN: [id number]  Principal Diagnosis: Bipolar disord, crnt episode mixed, severe, w psych features [facility])  Secondary Diagnoses: Principal Problem:   Bipolar disord, crnt episode mixed, severe, w psych features [facility])   Current Medications:  Current Facility-Administered Medications  Medication Dose Route Frequency Provider Last Rate Last Dose  . acetaminophen (TYLENOL) tablet 650 mg  650 mg Oral Q4H PRN Ethelene Hal, NP      . alum & mag hydroxide-simeth (MAALOX/MYLANTA) 200-200-20 MG/5ML suspension 30 mL  30 mL Oral Q4H PRN Ethelene Hal, NP      . benztropine (COGENTIN) tablet 0.5 mg  0.5 mg Oral Brynda Greathouse, MD   0.5 mg at 11/20/16 0724  . lithium carbonate (ESKALITH) CR tablet 450 mg  450 mg Oral BID Ethelene Hal, NP   450 mg at 11/20/16 0724  . lithium carbonate (LITHOBID) CR tablet 300 mg  300 mg Oral QHS Eappen, Saramma, MD      . magnesium hydroxide (MILK OF MAGNESIA) suspension 30 mL  30 mL Oral Daily PRN Ethelene Hal, NP      . OLANZapine [facility]) tablet 5 mg  5 mg Oral BID PRN Ursula Alert, MD   5 mg at 11/19/16 2249   Or  . OLANZapine (ZYPREXA) injection 5 mg  5 mg Intramuscular BID PRN Eappen, Saramma, MD      . OLANZapine (ZYPREXA) tablet 10 mg  10 mg Oral BH-qamhs Eappen, Saramma, MD   10 mg at 11/20/16 0724  . ondansetron (ZOFRAN) tablet 4 mg  4 mg Oral Q8H PRN Ethelene Hal, NP      . traZODone (DESYREL) tablet 125 mg  125 mg Oral QHS Ursula Alert, MD   125 mg at 11/19/16 2117   Current Outpatient Prescriptions  Medication Sig Dispense Refill  . benztropine (COGENTIN) 0.5 MG tablet Take 1 tablet (0.5 mg total) by mouth 2 (two) times daily in the am and at bedtime.. For prevention of drug induced tremors 60 tablet 0  . lithium carbonate (ESKALITH) 450 MG CR tablet Take 1 tablet (450 mg total) by mouth 2  (two) times daily. For mood stabilization 60 tablet 0  . lithium carbonate (LITHOBID) 300 MG CR tablet Take 1 tablet (300 mg total) by mouth at bedtime. For mood stabilization 30 tablet 0  . OLANZapine (ZYPREXA) 10 MG tablet Take 1 tablet (10 mg total) by mouth 2 (two) times daily in the am and at bedtime.. For mood control 60 tablet 0  . traZODone (DESYREL) 150 MG tablet Take 1 tablet (150 mg total) by mouth at bedtime. For sleep 30 tablet 0    PTA Medications: No prescriptions prior to admission.    Patient Stressors: Financial difficulties Medication change or noncompliance  Patient Strengths: Curator fund of knowledge Physical Health Supportive family/friends Work skills  Treatment Modalities: Medication Management, Group therapy, Case management,  1 to 1 session with clinician, Psychoeducation, Recreational therapy.   Physician Treatment Plan for Primary Diagnosis: Bipolar disord, crnt episode mixed, severe, w psych features [facility]) Long Term Goal(s): Improvement in symptoms so as ready for discharge  Short Term Goals: Ability to verbalize feelings will improve Ability to demonstrate self-control will improve Ability to identify and develop effective coping behaviors will improve Compliance with prescribed medications will improve Ability to identify changes in lifestyle to reduce recurrence of condition will improve Ability to verbalize feelings will  improve Compliance with prescribed medications will improve Ability to identify triggers associated with substance abuse/mental health issues will improve  Medication Management: Evaluate patient's response, side effects, and tolerance of medication regimen.  Therapeutic Interventions: 1 to 1 sessions, Unit Group sessions and Medication administration.  Evaluation of Outcomes: Adequate for Discharge  Physician Treatment Plan for Secondary Diagnosis: Principal Problem:   Bipolar disord, crnt episode  mixed, severe, w psych features ([place])   Long Term Goal(s): Improvement in symptoms so as ready for discharge  Short Term Goals: Ability to verbalize feelings will improve Ability to demonstrate self-control will improve Ability to identify and develop effective coping behaviors will improve Compliance with prescribed medications will improve Ability to identify changes in lifestyle to reduce recurrence of condition will improve Ability to verbalize feelings will improve Compliance with prescribed medications will improve Ability to identify triggers associated with substance abuse/mental health issues will improve  Medication Management: Evaluate patient's response, side effects, and tolerance of medication regimen.  Therapeutic Interventions: 1 to 1 sessions, Unit Group sessions and Medication administration.  Evaluation of Outcomes: Adequate for Discharge   RN Treatment Plan for Primary Diagnosis: Bipolar disord, crnt episode mixed, severe, w psych features [facility]) Long Term Goal(s): Knowledge of disease and therapeutic regimen to maintain health will improve  Short Term Goals: Ability to identify and develop effective coping behaviors will improve and Compliance with prescribed medications will improve  Medication Management: RN will administer medications as ordered by provider, will assess and evaluate patient's response and provide education to patient for prescribed medication. RN will report any adverse and/or side effects to prescribing provider.  Therapeutic Interventions: 1 on 1 counseling sessions, Psychoeducation, Medication administration, Evaluate responses to treatment, Monitor vital signs and CBGs as ordered, Perform/monitor CIWA, COWS, AIMS and Fall Risk screenings as ordered, Perform wound care treatments as ordered.  Evaluation of Outcomes: Adequate for Discharge   LCSW Treatment Plan for Primary Diagnosis: Bipolar disord, crnt episode mixed, severe, w psych  features [facility]) Long Term Goal(s): Safe transition to appropriate next level of care at discharge, Engage patient in therapeutic group addressing interpersonal concerns.  Short Term Goals: Engage patient in aftercare planning with referrals and resources  Therapeutic Interventions: Assess for all discharge needs, 1 to 1 time with Social worker, Explore available resources and support systems, Assess for adequacy in community support network, Educate family and significant other(s) on suicide prevention, Complete Psychosocial Assessment, Interpersonal group therapy.  Evaluation of Outcomes: Met  Return home [after staying with mother temporarily] and follow up [place] in Treatment: Attending groups: Yes Participating in groups: Yes Taking medication as prescribed: Yes Toleration medication: Yes, no side effects reported at this time Family/Significant other contact made: Yes Patient understands diagnosis: No  Limited insight Discussing patient identified problems/goals with staff: Yes Medical problems stabilized or resolved: Yes Denies suicidal/homicidal ideation: Yes Issues/concerns per patient self-inventory: None Other: N/A  New problem(s) identified: None identified at this time.   New Short Term/Long Term Goal(s): "I want to help as many people as possible while I am here."   Discharge Plan or Barriers:   Reason for Continuation of Hospitalization:  Estimated Length of Stay: D/C today  Attendees: Patient:  11/20/2016  10:26 AM  Physician: Ursula Alert, MD 11/20/2016  10:26 AM  Nursing: Desma Paganini, RN 11/20/2016  10:26 AM  RN Care Manager: Lars Pinks, RN 11/20/2016  10:26 AM  Social Worker: Ripley Fraise 11/20/2016  10:26 AM  Recreational Therapist: Winfield Cunas  11/20/2016  10:26 AM  Other: Norberto Sorenson 11/20/2016  10:26 AM  Other:  11/20/2016  10:26 AM    Scribe for Treatment Team:  Roque Lias LCSW 11/20/2016 10:26 AM

## 2016-11-20 NOTE — Progress Notes (Signed)
Patient ID: Ronald BushDavid Fritz, male   DOB: 30-Mar-1981, [age] y.o.   MRN: [id number] Patient discharged to home/self care.  Patient acknowledged understanding of all discharge instructions.  All of patient's personal belongings were returned at the time of discharge.  Patient is pleasant and denies, SI, HI and AVH upon discharge.
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
## 2016-11-20 NOTE — [facility] Suicide Risk Assessment (Signed)
[facility]   Principal Problem: Bipolar disord, crnt episode mixed, severe, w psych features [facility](HCC) Discharge Diagnoses:  Patient Active Problem List   Diagnosis Date Noted  . 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 Bipolar disord, crnt episode mixed, severe, w psych features (HCC) [F31.64] 11/12/2016  . Acute cholecystitis [K81.0] 10/03/2015    Total Time spent with patient: 30 minutes  Musculoskeletal: Strength & Muscle Tone: within normal limits Gait & Station: normal Patient leans: N/A  Psychiatric Specialty Exam: Review of Systems  Psychiatric/Behavioral: Negative for depression, hallucinations and suicidal ideas.  All other systems reviewed and are negative.   Blood pressure 115/74, pulse 93, temperature 97.8 F (36.6 C), temperature source Oral, resp. rate 18, height 6\' 3"  (1.905 m), weight 115.2 kg (254 lb), SpO2 99 %.Body mass index is 31.75 kg/m.  General Appearance: Casual  Eye Contact::  Fair  Speech:  Clear and Coherent409  Volume:  Normal  Mood:  Euthymic  Affect:  Appropriate  Thought Process:  Goal Directed and Descriptions of Associations: Intact  Orientation:  Full (Time, Place, and Person)  Thought Content:  Logical  Suicidal Thoughts:  No  Homicidal Thoughts:  No  Memory:  Immediate;   Fair Recent;   Fair Remote;   Fair  Judgement:  Fair  Insight:  Fair  Psychomotor Activity:  Normal  Concentration:  Fair  Recall:  FiservFair  Fund of Knowledge:Fair  Language: Fair  Akathisia:  No  Handed:  Right  AIMS (if indicated):     Assets:  Communication Skills Desire for Improvement  Sleep:  Number of Hours: 5  Cognition: WNL  ADL's:  Intact   Mental Status Per Nursing Assessment::   On Admission:  NA  Demographic Factors:  Male and Caucasian  Loss Factors: NA  Historical Factors: Impulsivity  Risk Reduction Factors:   Positive social support and Positive therapeutic relationship  Continued  Clinical Symptoms:  Previous Psychiatric Diagnoses and Treatments  Cognitive Features That Contribute To Risk:  None    Suicide Risk:  Minimal: No identifiable suicidal ideation.  Patients presenting with no risk factors but with morbid ruminations; may be classified as minimal risk based on the severity of the depressive symptoms  Follow-up Information    [facility] Follow up on 11/20/2016.   Why:  Wednesday at 11:00. Then Friday again.   Contact information: [address] [place] [address] [phone]           Plan Of Care/Follow-up recommendations:  Activity:  no restrictions Diet:  regular Tests:  Li level on July 2 nd 2018 , prior to AM dose of Li. Other:  none  Zoeann Mol, MD 11/20/2016, 9:24 AM

## 2016-11-20 NOTE — Plan of Care (Signed)
Problem: [facility] Participation in Recreation Therapeutic Interventions Goal: STG-Patient will identify at least five coping skills for ** STG: Coping Skills - Patient will be able to identify at least 5 coping skills for manic behavior by conclusion of recreation therapy tx  Outcome: Completed/Met Date Met: 11/20/16 Pt was able to identify coping skills at completion of coping skills recreation therapy session.  Victorino Sparrow, LRT/CTRS

## 2017-03-10 IMAGING — CT CT ABD-PELV W/ CM
2 of 4 series · 16 of 46 positions shown, 18 images · IV contrast (APPLIED)
Comparison: Radiographs 09/30/2015

CLINICAL DATA: Gradually worsening epigastric pain, worse last
night when that awakened him from sleep.

EXAM:
CT ABDOMEN AND PELVIS WITH CONTRAST
TECHNIQUE: Multidetector CT imaging of the abdomen and pelvis was performed
using the standard protocol following bolus administration of
intravenous contrast.
CONTRAST:  100mL [id number] IOPAMIDOL ([id number]) INJECTION 61%

[Series 2: axial st · axial · 0.90mm/px · z∈[-421,+74]mm · 13 of 109 slices shown, 15 images]
[im 5/109  soft-tissue]
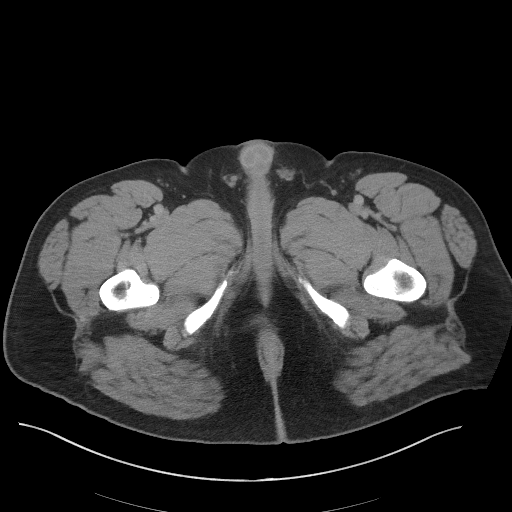
[im 5/109  bone]
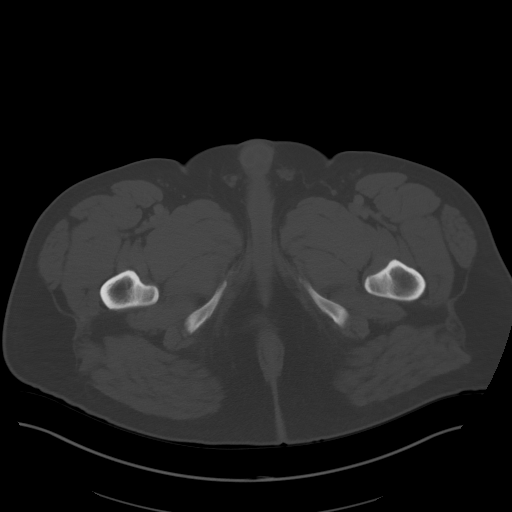
[im 14/109  soft-tissue]
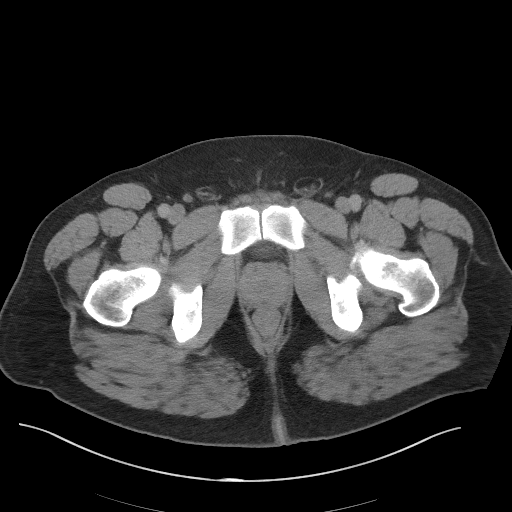
[im 23/109  soft-tissue]
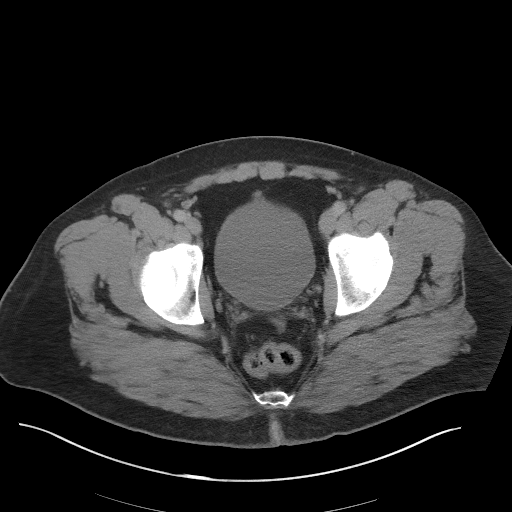
[im 32/109  soft-tissue]
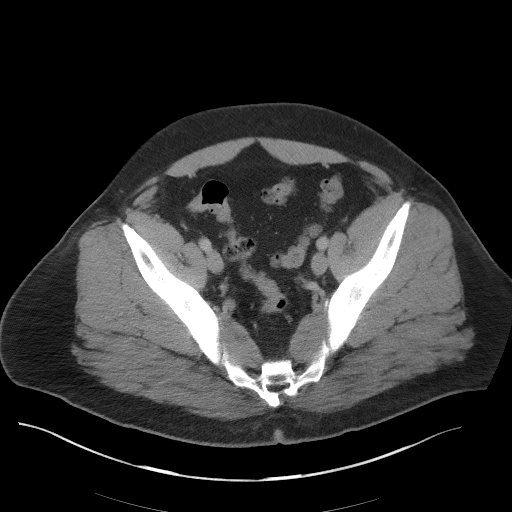
[im 37/109  soft-tissue]
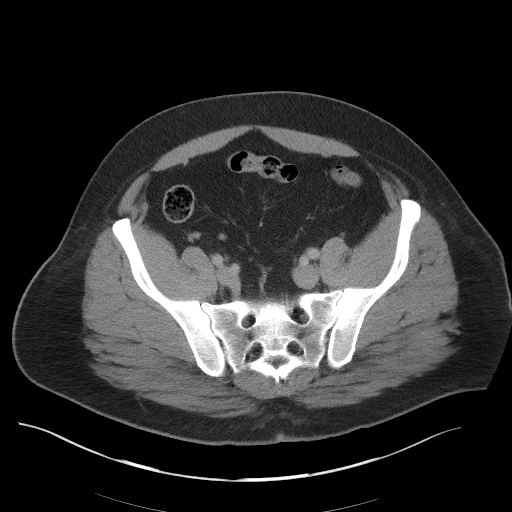
[im 46/109  soft-tissue]
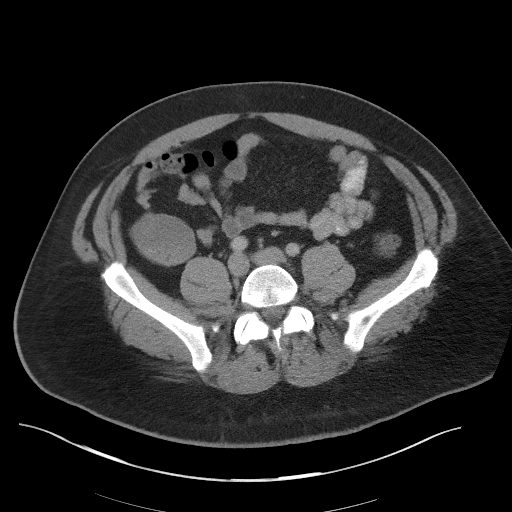
[im 55/109  soft-tissue]
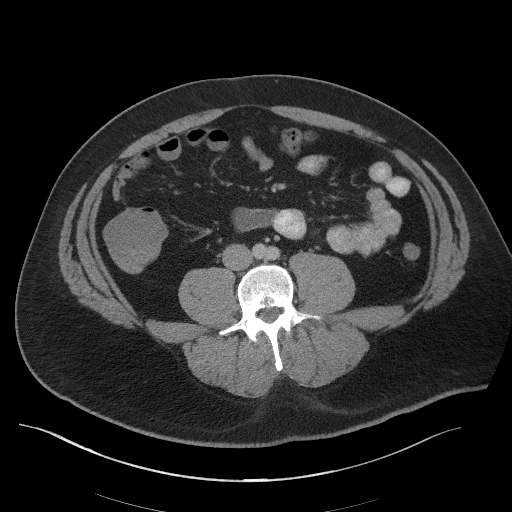
[im 64/109  soft-tissue]
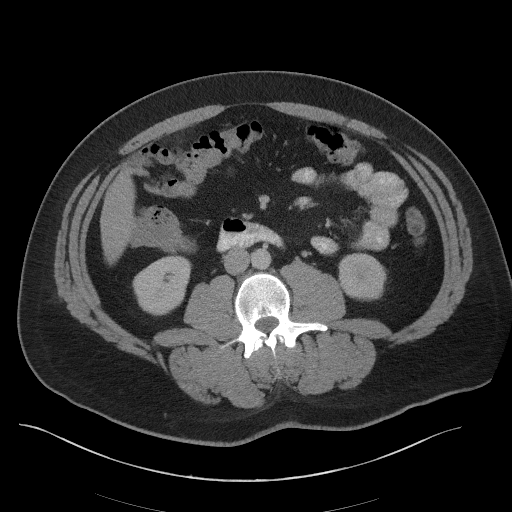
[im 73/109  soft-tissue]
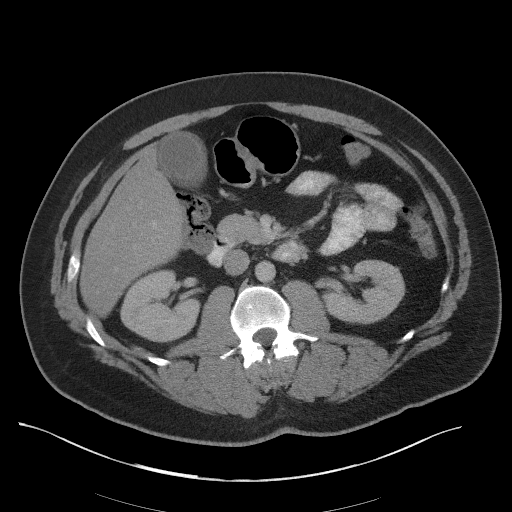
[im 73/109  bone]
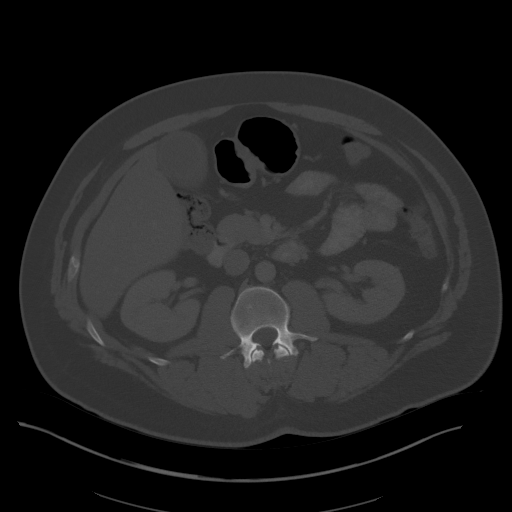
[im 77/109  soft-tissue]
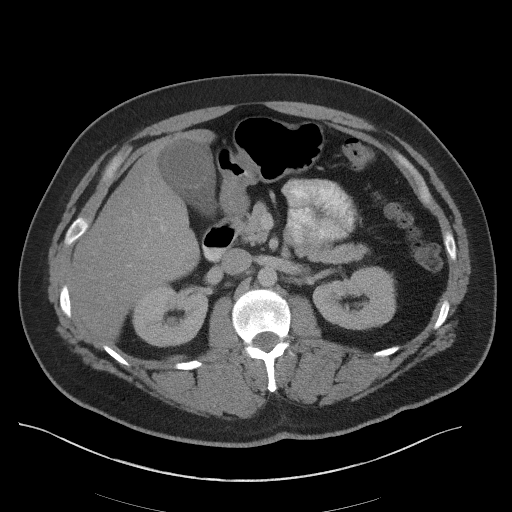
[im 86/109  soft-tissue]
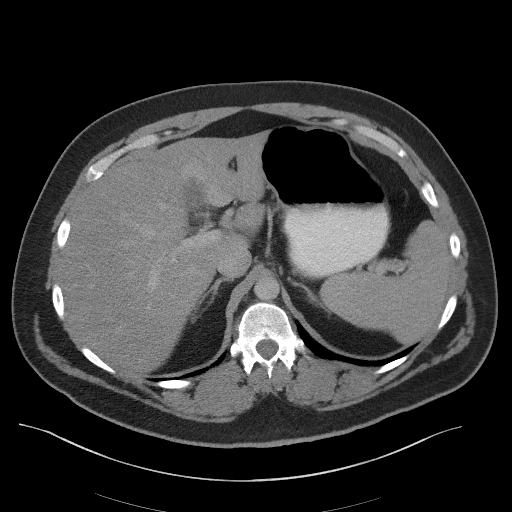
[im 95/109  soft-tissue]
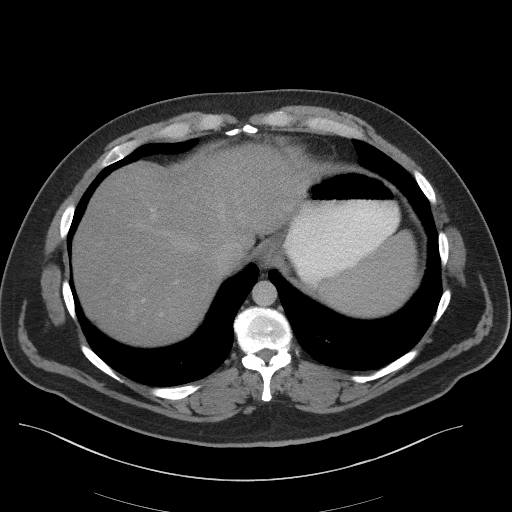
[im 104/109  soft-tissue]
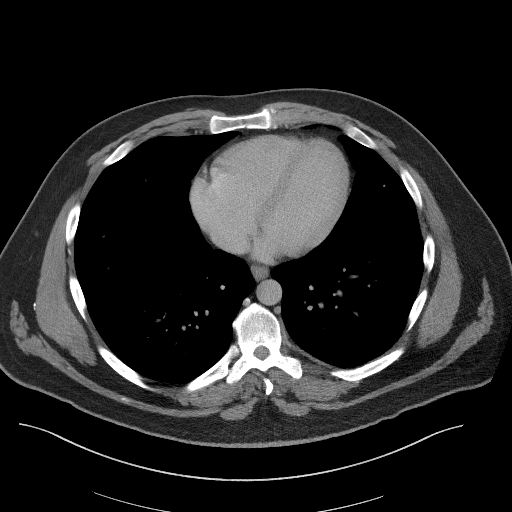

[Series 5: coronal st · coronal · 0.82mm/px · 3 of 115 slices shown]
[im 39/115  soft-tissue]
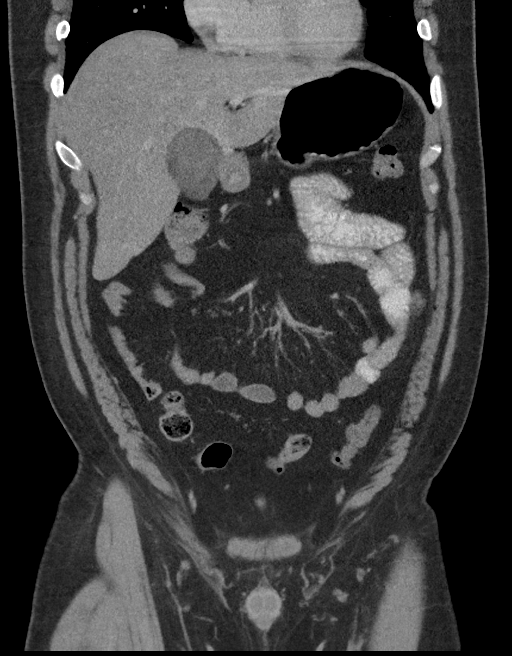
[im 51/115  soft-tissue]
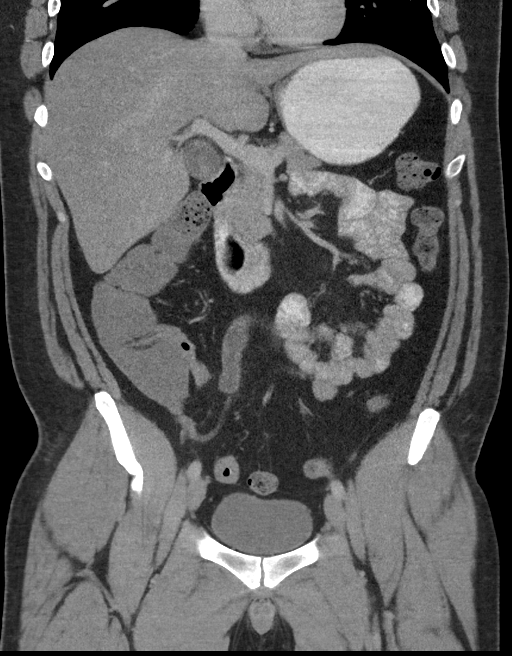
[im 64/115  soft-tissue]
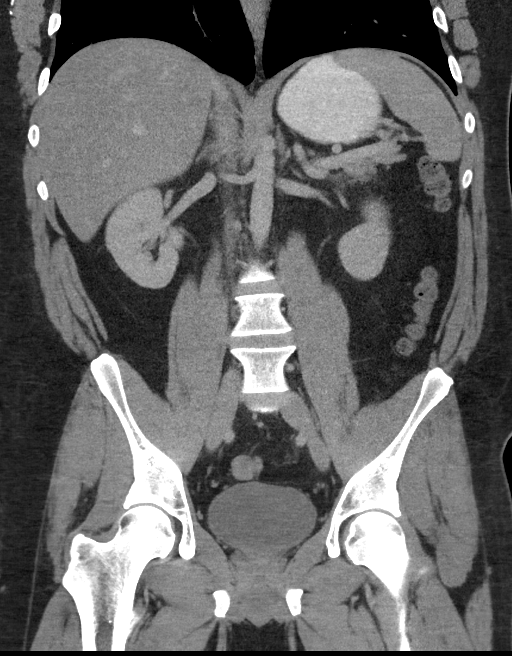

[16 of 46 positions shown; findings below may reference images not displayed]

FINDINGS: There are at least 2 calculi within the gallbladder, measuring up to
2.5 cm. The gallbladder wall appears thickened and edematous. No
bile duct dilatation. Liver is normal.

The spleen, pancreas, adrenals and kidneys are normal. Ureters and
urinary bladder are unremarkable. Bowel is unremarkable. Appendix is
normal.

The abdominal aorta is normal in caliber. There is no
atherosclerotic calcification. There is no adenopathy in the abdomen
or pelvis.

There is no significant abnormality in the lower chest. There is no
significant musculoskeletal abnormality.
IMPRESSION: Cholelithiasis with thickened gallbladder wall. This could represent
acute cholecystitis. Consider ultrasound for better
characterization. No other significant abnormality is evident in the
abdomen or pelvis. Lung bases are clear. These results will be
called to the ordering clinician or representative by the
Radiologist Assistant, and communication documented in the PACS or
zVision Dashboard.

## 2017-04-26 IMAGING — US US ABDOMEN LIMITED
1 series · 14 of 25 positions shown · non-contrast
Comparison: CT 09/30/2015

CLINICAL DATA: Right upper abdominal pain. Cholelithiasis on recent
CT

EXAM:
US ABDOMEN LIMITED - RIGHT UPPER QUADRANT

[Series 1: us abdomen limited · 0.33mm/px · 14 of 61 slices shown]
[im 1/61]
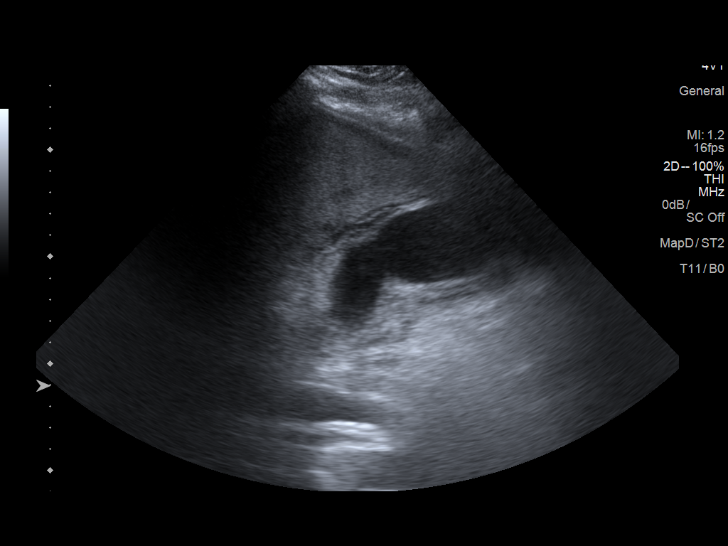
[im 6/61]
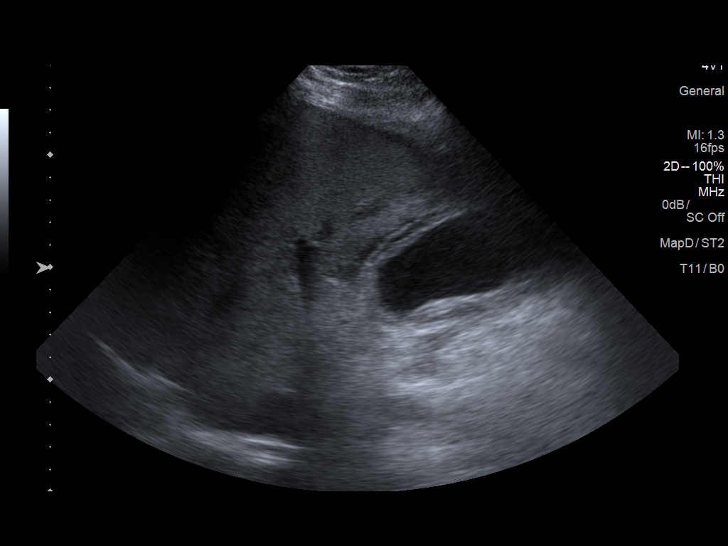
[im 11/61]
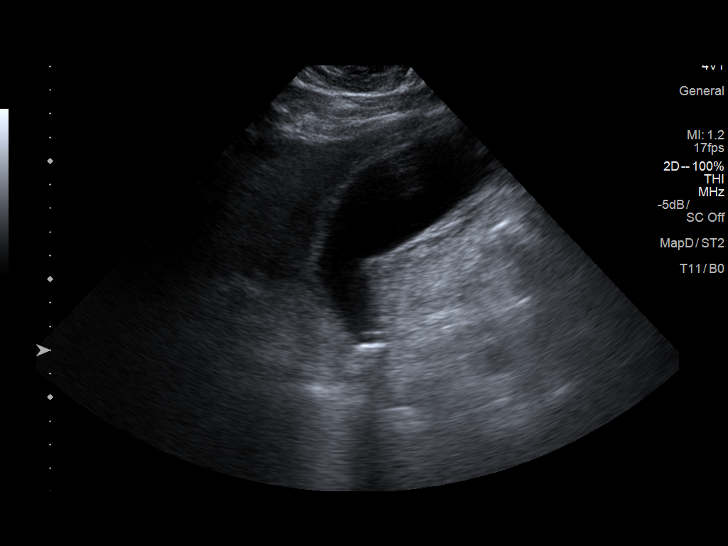
[im 16/61]
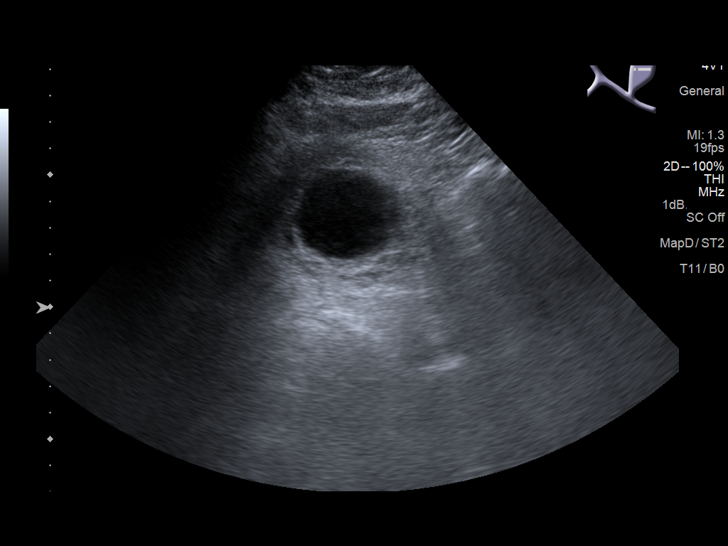
[im 21/61]
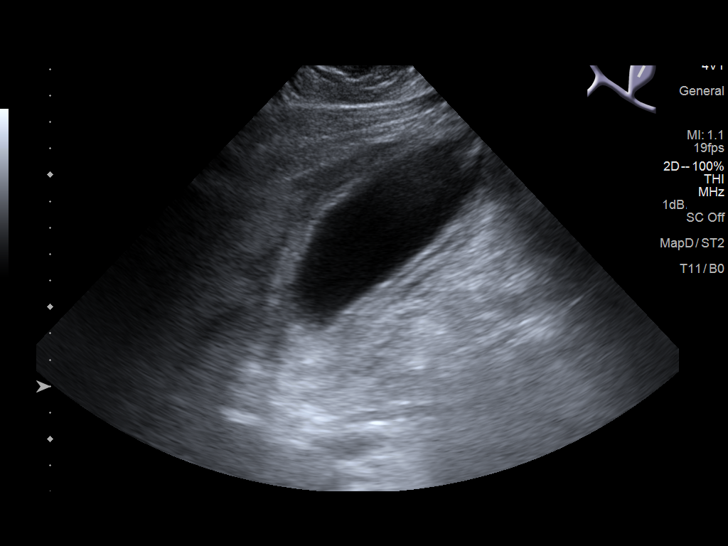
[im 23/61]
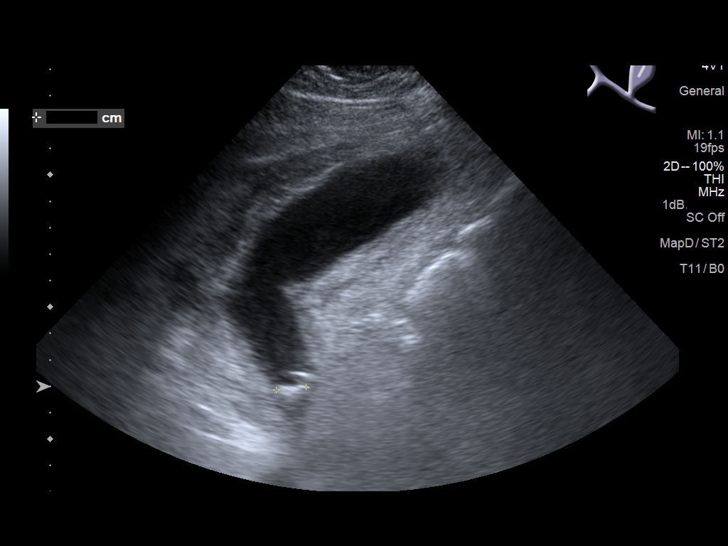
[im 28/61]
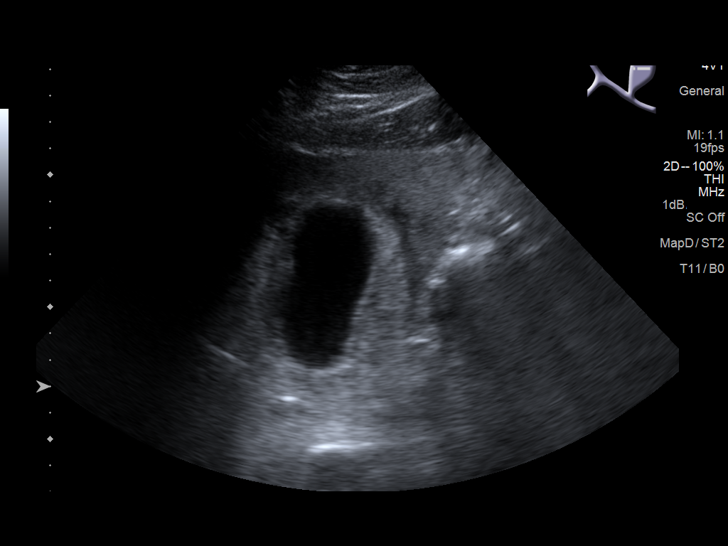
[im 33/61]
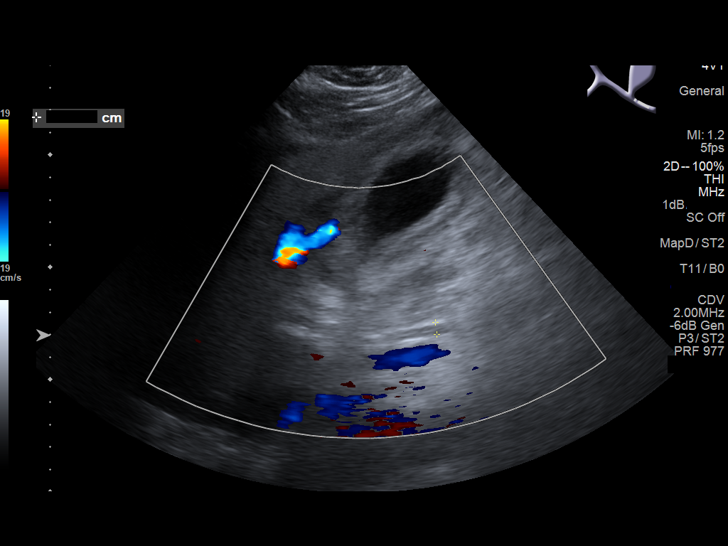
[im 38/61]
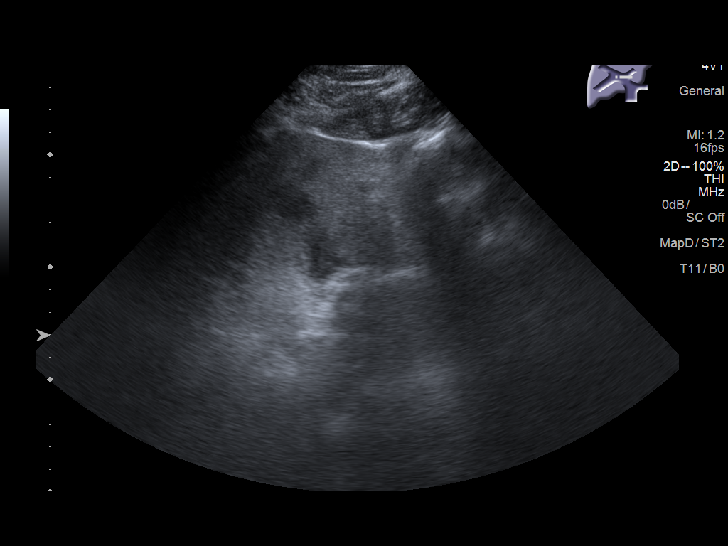
[im 41/61]
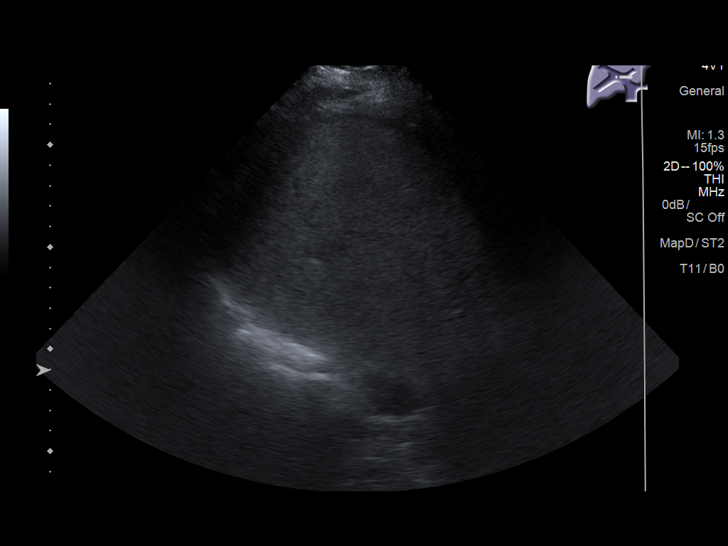
[im 46/61]
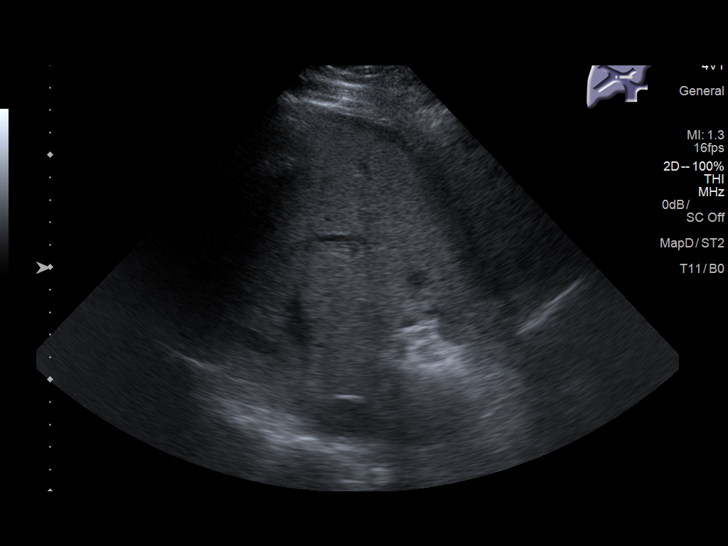
[im 51/61]
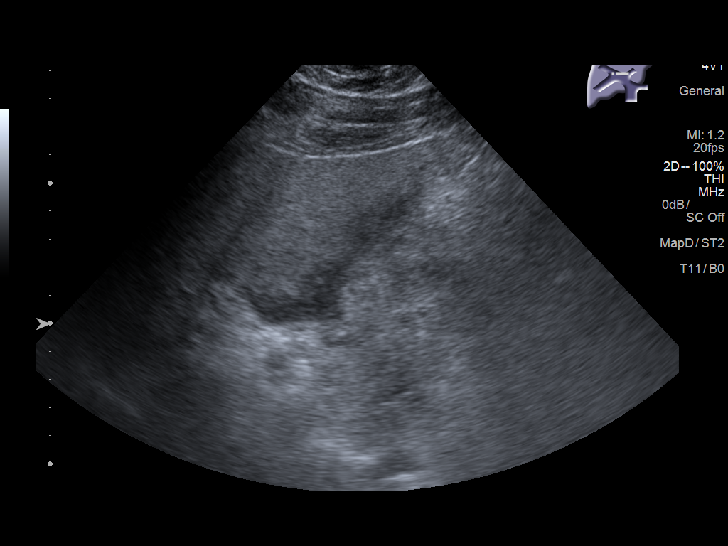
[im 56/61]
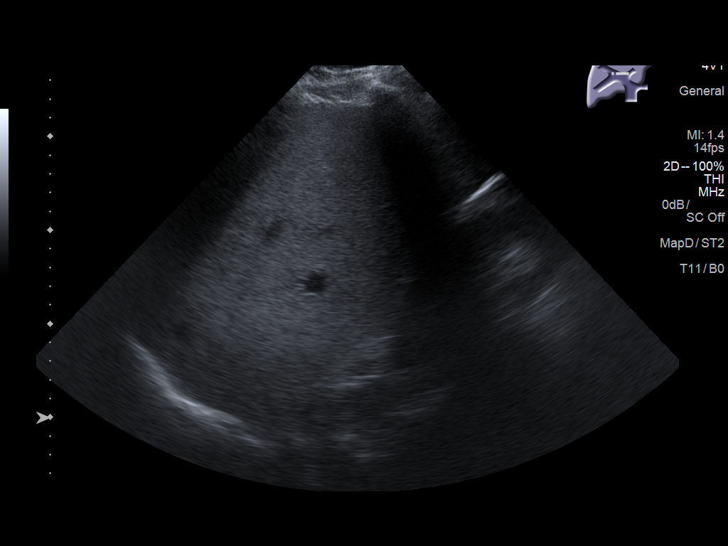
[im 61/61]
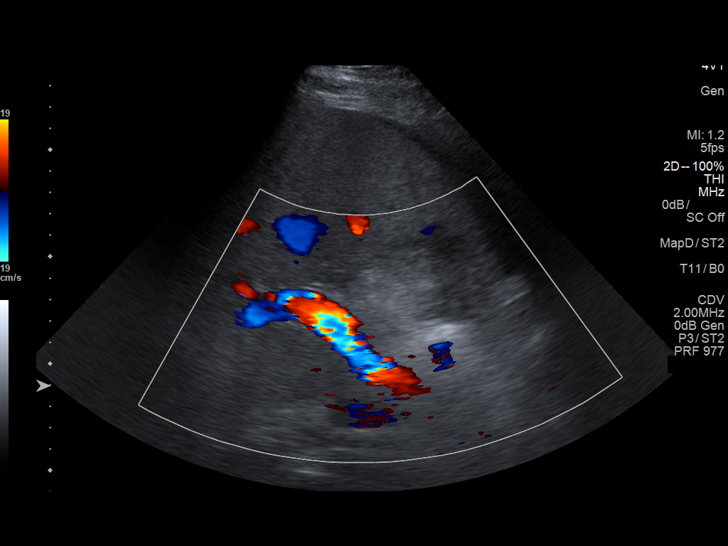

[14 of 25 positions shown; findings below may reference images not displayed]

FINDINGS: Gallbladder:

Physiologically distended. Calculi measured up to 22 mm diameter.
Wall thickening up to 2.8 mm, edematous. Trace pericholecystic
fluid.

Common bile duct:

Diameter: 5.4 mm

Liver:

No focal lesion identified. Within normal limits in parenchymal
echogenicity. Trace perihepatic ascites.
IMPRESSION: 1. Cholelithiasis with gallbladder wall thickening suggesting acute
cholecystitis. If the clinical picture is inconclusive,
hepatobiliary scintigraphy may be useful.

## 2017-07-30 ENCOUNTER — Inpatient Hospital Stay (HOSPITAL_COMMUNITY)
Admission: RE | Admit: 2017-07-30 | Discharge: 2017-08-03 | DRG: 885 | Disposition: A | Discharge status: Home / Self Care | Payer: BLUE CROSS/BLUE SHIELD | Attending: Psychiatry | Admitting: Psychiatry

## 2017-07-30 ENCOUNTER — Other Ambulatory Visit: Payer: Self-pay

## 2017-07-30 ENCOUNTER — Encounter (HOSPITAL_COMMUNITY): Payer: Self-pay | Admitting: *Deleted

## 2017-07-30 DIAGNOSIS — R450 Nervousness: Secondary | ICD-10-CM | POA: Diagnosis not present

## 2017-07-30 DIAGNOSIS — F419 Anxiety disorder, unspecified: Secondary | ICD-10-CM | POA: Diagnosis present

## 2017-07-30 DIAGNOSIS — F314 Bipolar disorder, current episode depressed, severe, without psychotic features: Secondary | ICD-10-CM | POA: Diagnosis present

## 2017-07-30 DIAGNOSIS — Z79899 Other long term (current) drug therapy: Secondary | ICD-10-CM | POA: Diagnosis not present

## 2017-07-30 DIAGNOSIS — R45851 Suicidal ideations: Secondary | ICD-10-CM | POA: Diagnosis present

## 2017-07-30 DIAGNOSIS — Z818 Family history of other mental and behavioral disorders: Secondary | ICD-10-CM | POA: Diagnosis not present

## 2017-07-30 DIAGNOSIS — G4700 Insomnia, unspecified: Secondary | ICD-10-CM | POA: Diagnosis present

## 2017-07-30 MED ORDER — ACETAMINOPHEN 325 MG PO TABS: 650.0000 mg | ORAL_TABLET | Freq: Four times a day (QID) | ORAL | Status: DC | PRN

## 2017-07-30 MED ORDER — MAGNESIUM HYDROXIDE 400 MG/5ML PO SUSP: 30.0000 mL | Freq: Every day | ORAL | Status: DC | PRN

## 2017-07-30 MED ORDER — TRAZODONE HCL 50 MG PO TABS
50.0000 mg | ORAL_TABLET | Freq: Every evening | ORAL | Status: DC | PRN
  Administered 2017-07-30 – 2017-08-02 (×4): 50 mg via ORAL
  Filled 2017-07-30 (×12): qty 1

## 2017-07-30 MED ORDER — ALUM & MAG HYDROXIDE-SIMETH 200-200-20 MG/5ML PO SUSP: 30.0000 mL | ORAL | Status: DC | PRN

## 2017-07-30 NOTE — H&P (Signed)
Behavioral Health Medical Screening Exam  Ronald Fritz is an 37 y.o. male [facility] with complaints of suicidal ideation; no specific plan; but is unable to contract for safety.  Worsening depression for several months and unstable environment at home; wife not supportive.  Prior inpatient/outpatient history.  Was on Lithium at one time has been off since November 2018.  Continues to see a therapist.    Total Time spent with patient: 45 minutes  Psychiatric Specialty Exam: Physical Exam  Vitals reviewed. Constitutional: He is oriented to person, place, and time. He appears well-nourished.  Obese  HENT:  Head: Normocephalic.  Neck: Normal range of motion. Neck supple.  Respiratory: Effort normal.  Musculoskeletal: Normal range of motion.  Neurological: He is alert and oriented to person, place, and time.  Skin: Skin is warm and dry.    Review of Systems  Psychiatric/Behavioral: Positive for depression and suicidal ideas. The patient is nervous/anxious.   All other systems reviewed and are negative.   Blood pressure 124/78, pulse 72, temperature 98.1 F (36.7 C), resp. rate 16, SpO2 98 %.There is no height or weight on file to calculate BMI.  General Appearance: Casual and Neat  Eye Contact:  Good  Speech:  Clear and Coherent and Normal Rate  Volume:  Normal  Mood:  Depressed and Hopeless  Affect:  Depressed and Flat  Thought Process:  Coherent and Goal Directed  Orientation:  Full (Time, Place, and Person)  Thought Content:  Denies hallucinations, delusions and paranoia  Suicidal Thoughts:  Yes. with intent; but no plan  Unable to contract for safety  Homicidal Thoughts:  No  Memory:  Immediate;   Good Recent;   Good Remote;   Good  Judgement:  Poor  Insight:  Lacking  Psychomotor Activity:  Normal  Concentration: Concentration: Good and Attention Span: Good  Recall:  Good  Fund of Knowledge:Good  Language: Good  Akathisia:  No  Handed:  Right   AIMS (if indicated):     Assets:  Communication Skills Desire for Improvement Housing Physical Health  Sleep:       Musculoskeletal: Strength & Muscle Tone: within normal limits Gait & Station: normal Patient leans: N/A  Blood pressure 124/78, pulse 72, temperature 98.1 F (36.7 C), resp. rate 16, SpO2 98 %.  Recommendations:  Inpatient psychiatric treatment.  [facility]  Based on my evaluation the patient does not appear to have an emergency medical condition.  Ronald Decoteau, NP 07/30/2017, 5:51 PM

## 2017-07-30 NOTE — Progress Notes (Signed)
Patient ID: [name], male   DOB: 09-03-1980, 37 y.o.   MRN: [id number]  Pt currently presents with a worried affect and cooperative behavior. Pt interacts positively with peers, interaction guarded. Pt attends 400 hall group tonight. Pt states "I am here to get better." Pt reports good sleep PTA, denies needing any sleep aids during admission.   Pt's labs and vitals were monitored throughout the night. Pt given a 1:1 about emotional and mental status. Pt supported and encouraged to express concerns and questions. Verbally agrees to Lexicographernotify writer if he has difficulty sleeping tonight.   Pt's safety ensured with 15 minute and environmental checks. Pt currently denies SI/HI and A/V hallucinations. Pt verbally agrees to seek staff if SI/HI or A/VH occurs and to consult with staff before acting on any harmful thoughts. Will continue POC.

## 2017-07-30 NOTE — Progress Notes (Signed)
The patient attended Wrap-Up group on the 400 hallway and was appropriate.  

## 2017-07-30 NOTE — Progress Notes (Signed)
Ronald Fritz is a 37 year old male pt admitted on voluntary basis after presenting as a walk-in. He reports that he has been feeling depressed and suicidal and reports that he has not been on any medications for the past couple of months. He reports that his psychiatrist was slowly decreasing the dose and then spoke about how a new medication was added that did not agree with him and reports that it was making his nauseous. He also reports having marital problems and reports that he currently lives with his wife and 2 children but unsure if he will go back there after discharge. He denies any substance abuse issues. He reports that he remembers being here last year and remembers that he was in a manic episode and spoke about how that was the only time that has happened to him. He does endorse passive SI on admission and is able to contract for safety while in the hospital. He was oriented to the unit and safety maintained.

## 2017-07-30 NOTE — Tx Team (Signed)
Initial Treatment Plan 07/30/2017 7:26 PM Ronald Bushavid Gonzales [id number]    PATIENT STRESSORS: Marital or family conflict Medication change or noncompliance Occupational concerns   PATIENT STRENGTHS: Ability for insight Average or above average intelligence Capable of independent living Communication skills General fund of knowledge Motivation for treatment/growth   PATIENT IDENTIFIED PROBLEMS: Depression Anxiety Suicidal thoughts "Trying to plot a path forward, learn how to forgive"                     DISCHARGE CRITERIA:  Ability to meet basic life and health needs Improved stabilization in mood, thinking, and/or behavior Reduction of life-threatening or endangering symptoms to within safe limits Verbal commitment to aftercare and medication compliance  PRELIMINARY DISCHARGE PLAN: Attend aftercare/continuing care group  PATIENT/FAMILY INVOLVEMENT: This treatment plan has been presented to and reviewed with the patient, Ronald Fritz, and/or family member, .  The patient and family have been given the opportunity to ask questions and make suggestions.  Anysia Choi, [place], [place] 07/30/2017, 7:26 PM

## 2017-07-31 DIAGNOSIS — R450 Nervousness: Secondary | ICD-10-CM

## 2017-07-31 DIAGNOSIS — F314 Bipolar disorder, current episode depressed, severe, without psychotic features: Principal | ICD-10-CM

## 2017-07-31 DIAGNOSIS — Z818 Family history of other mental and behavioral disorders: Secondary | ICD-10-CM

## 2017-07-31 DIAGNOSIS — F419 Anxiety disorder, unspecified: Secondary | ICD-10-CM

## 2017-07-31 LAB — CBC
HCT: 46.7 % (ref 39.0–52.0)
Hemoglobin: 16.2 g/dL (ref 13.0–17.0)
MCH: 29.1 pg (ref 26.0–34.0)
MCHC: 34.7 g/dL (ref 30.0–36.0)
MCV: 83.8 fL (ref 78.0–100.0)
Platelets: 225 10*3/uL (ref 150–400)
RBC: 5.57 MIL/uL (ref 4.22–5.81)
RDW: 13.3 % (ref 11.5–15.5)
WBC: 6.8 10*3/uL (ref 4.0–10.5)

## 2017-07-31 LAB — RAPID URINE DRUG SCREEN, HOSP PERFORMED
AMPHETAMINES: NOT DETECTED
Barbiturates: NOT DETECTED
Benzodiazepines: NOT DETECTED
Cocaine: NOT DETECTED
Opiates: NOT DETECTED
Tetrahydrocannabinol: POSITIVE — AB

## 2017-07-31 LAB — URINALYSIS, COMPLETE (UACMP) WITH MICROSCOPIC
BILIRUBIN URINE: NEGATIVE
Glucose, UA: NEGATIVE mg/dL
HGB URINE DIPSTICK: NEGATIVE
KETONES UR: NEGATIVE mg/dL
LEUKOCYTES UA: NEGATIVE
Nitrite: NEGATIVE
PROTEIN: NEGATIVE mg/dL
RBC / HPF: NONE SEEN RBC/hpf (ref 0–5)
Specific Gravity, Urine: 1.029 (ref 1.005–1.030)
Squamous Epithelial / LPF: NONE SEEN
WBC UA: NONE SEEN WBC/hpf (ref 0–5)
pH: 5 (ref 5.0–8.0)

## 2017-07-31 LAB — LIPID PANEL
CHOL/HDL RATIO: 4.1 ratio
CHOLESTEROL: 149 mg/dL (ref 0–200)
HDL: 36 mg/dL — ABNORMAL LOW (ref >40)
LDL Cholesterol: 90 mg/dL (ref 0–99)
Triglycerides: 116 mg/dL (ref <150)
VLDL: 23 mg/dL (ref 0–40)

## 2017-07-31 LAB — HEMOGLOBIN A1C
Hgb A1c MFr Bld: 5 % (ref 4.8–5.6)
MEAN PLASMA GLUCOSE: 96.8 mg/dL

## 2017-07-31 LAB — COMPREHENSIVE METABOLIC PANEL
ALBUMIN: 4.4 g/dL (ref 3.5–5.0)
ALT: 46 U/L (ref 17–63)
AST: 31 U/L (ref 15–41)
Alkaline Phosphatase: 59 U/L (ref 38–126)
Anion gap: 10 (ref 5–15)
BUN: 12 mg/dL (ref 6–20)
CO2: 25 mmol/L (ref 22–32)
Calcium: 9.5 mg/dL (ref 8.9–10.3)
Chloride: 104 mmol/L (ref 101–111)
Creatinine, Ser: 1.03 mg/dL (ref 0.61–1.24)
GFR calc Af Amer: >60 mL/min (ref >60)
GFR calc non Af Amer: >60 mL/min (ref >60)
GLUCOSE: 110 mg/dL — AB (ref 65–99)
POTASSIUM: 4.1 mmol/L (ref 3.5–5.1)
SODIUM: 139 mmol/L (ref 135–145)
TOTAL PROTEIN: 8 g/dL (ref 6.5–8.1)
Total Bilirubin: 0.8 mg/dL (ref 0.3–1.2)

## 2017-07-31 LAB — ETHANOL

## 2017-07-31 LAB — TSH: TSH: 1.092 u[IU]/mL (ref 0.350–4.500)

## 2017-07-31 MED ORDER — LORAZEPAM 0.5 MG PO TABS: 0.5000 mg | ORAL_TABLET | Freq: Four times a day (QID) | ORAL | Status: DC | PRN

## 2017-07-31 MED ORDER — LAMOTRIGINE 25 MG PO TABS
25.0000 mg | ORAL_TABLET | Freq: Every day | ORAL | Status: DC
  Administered 2017-07-31 – 2017-08-03 (×4): 25 mg via ORAL
  Filled 2017-07-31 (×7): qty 1

## 2017-07-31 MED ORDER — LURASIDONE HCL 40 MG PO TABS
ORAL_TABLET | ORAL | Status: AC
  Filled 2017-07-31: qty 1

## 2017-07-31 MED ORDER — LURASIDONE HCL 20 MG PO TABS
20.0000 mg | ORAL_TABLET | Freq: Every day | ORAL | Status: DC
  Administered 2017-07-31 – 2017-08-03 (×4): 20 mg via ORAL
  Filled 2017-07-31 (×6): qty 1

## 2017-07-31 NOTE — Progress Notes (Signed)
Adult Psychoeducational Group Note  Date:  07/31/2017 Time:  1600  Group Topic/Focus:  Coping With Mental Health Crisis:   The purpose of this group is to help patients identify strategies for coping with mental health crisis.  Group discusses possible causes of crisis and ways to manage them effectively.  Participation Level:  Active  Participation Quality:  Appropriate  Affect:  Appropriate  Cognitive:  Appropriate  Insight: Appropriate  Engagement in Group:  Engaged  Modes of Intervention:  Activity  Additional Comments:    Ryin Ambrosius L 07/31/2017, 5:15 PM            

## 2017-07-31 NOTE — Progress Notes (Signed)
Pt presents with a flat affect and depressed mood. Pt rates depression 9/10, Anxiety 3/10. Hopelessness 9/10. Pt denies SI/HI. Pt reports fair sleep last night. Pt denies taking any medications for depression. Pt expressed seeking tx for ongoing depression. Pt denies substance abuse. Discussed with pt, moving him to 400 hall for tx since he's currently programing on 400 hall. Orders reviewed. Verbal support provided. Pt encouraged to attend groups. 15 minute checks performed for safety.

## 2017-07-31 NOTE — H&P (Addendum)
Psychiatric Admission Assessment Adult  Patient Identification: Ronald Fritz MRN:  [id number] Date of Evaluation:  07/31/2017 Chief Complaint:  MDD Principal Diagnosis: Bipolar 1 disorder, depressed, severe ([place]) Diagnosis:   Patient Active Problem List   Diagnosis Date Noted  . Bipolar 1 disorder, depressed, severe ([place]) [F31.4] 07/30/2017  . Affective psychosis, bipolar ([place]) [F31.9] 11/15/2016  . Bipolar disord, crnt episode mixed, severe, w psych features ([place]) [F31.64] 11/12/2016  . Acute cholecystitis [K81.0] 10/03/2015   History of Present Illness:  37 year old white male, married, 2 children both boys ages 97 and 2. He reports that last year he was on Lithium after a manic episode and was being tapered off due to feeling like he was in a fog all the time. In October they tried to introduce Abilify but it caused severe nausea and headaches, so they tapered off the Lithium and he has been going to counseling through the [facility]. He is not opposed to any new medications but specifically states no Lithium and that Abilify caused problems. He denies any knowledge of any antidepressants.  He reports that he has had been having a lot of stressors at home. His wife became ill in 2017 and had numerous medical problems which strained their marriage. She became more agitated with his and she is verbally abusive towards him. He feels that the marriage is a toxic relationship and that it will end in marriage and they have tried counseling. 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 He denies any current SI/HI/AVH and contracts for safety.  Recently however, he has felt depressed, lack of motivation, fatigue, increased weight and appetite, SI, and anxiety. He denies any manic symptoms as well.   Associated Signs/Symptoms: Depression Symptoms:  depressed mood, fatigue, feelings of  worthlessness/guilt, difficulty concentrating, hopelessness, recurrent thoughts of death, suicidal thoughts without plan, anxiety, loss of energy/fatigue, weight gain, increased appetite, (Hypo) Manic Symptoms:  denies Anxiety Symptoms:  Excessive Worry, Social Anxiety, Psychotic Symptoms:  Denies PTSD Symptoms: NA Total Time spent with patient: 45 minutes  Past Psychiatric History: 2 previous hospitalizations, currently [place], Bipolar I  Is the patient at risk to self? Yes.    Has the patient been a risk to self in the past 6 months? No.  Has the patient been a risk to self within the distant past? No.  Is the patient a risk to others? No.  Has the patient been a risk to others in the past 6 months? No.  Has the patient been a risk to others within the distant past? No.   Prior Inpatient Therapy: Prior Inpatient Therapy: Yes Prior Therapy Dates: 2018 Prior Therapy Facilty/Provider(s): [facility] Reason for Treatment: depression Prior Outpatient Therapy: Prior Outpatient Therapy: Yes Prior Therapy Dates: 2018 Prior Therapy Facilty/Provider(s): NA Reason for Treatment: nA Does patient have an ACCT team?: No Does patient have Intensive In-House Services?  : No Does patient have [facility] services? : No Does patient have P4CC services?: No  Alcohol Screening: 1. How often do you have a drink containing alcohol?: Never 2. How many drinks containing alcohol do you have on a typical day when you are drinking?: 1 or 2 3. How often do you have six or more drinks on one occasion?: Never AUDIT-C Score: 0 4. How often during the last year have you found that you were not able to stop drinking once you  had started?: Never 5. How often during the last year have you failed to do what was normally expected from you becasue of drinking?: Never 6. How often during the last year have you needed a first drink in the morning to get yourself going after a heavy drinking session?:  Never 7. How often during the last year have you had a feeling of guilt of remorse after drinking?: Never 8. How often during the last year have you been unable to remember what happened the night before because you had been drinking?: Never 9. Have you or someone else been injured as a result of your drinking?: No 10. Has a relative or friend or a doctor or another health worker been concerned about your drinking or suggested you cut down?: No Alcohol Use Disorder Identification Test Final Score (AUDIT): 0 Intervention/Follow-up: AUDIT Score <7 follow-up not indicated Substance Abuse History in the last 12 months:  No. Consequences of Substance Abuse: NA Previous Psychotropic Medications: Yes  Psychological Evaluations: Yes  Past Medical History: History reviewed. No pertinent past medical history.  Past Surgical History:  Procedure Laterality Date  . CHOLECYSTECTOMY N/A 10/04/2015   Procedure: LAPAROSCOPIC CHOLECYSTECTOMY;  Surgeon: Ralene Ok, MD;  Location: [facility];  Service: General;  Laterality: N/A;   Family History: History reviewed. No pertinent family history. Family Psychiatric  History: Father- Bipolar; multiple relatives with depression or anxiety Tobacco Screening: Have you used any form of tobacco in the last 30 days? (Cigarettes, Smokeless Tobacco, Cigars, and/or Pipes): No Social History:  Social History   Substance and Sexual Activity  Alcohol Use No     Social History   Substance and Sexual Activity  Drug Use No    Additional Social History: Marital status: Married    Pain Medications: please see mar Prescriptions: please see mar Over the Counter: please see mar History of alcohol / drug use?: No history of alcohol / drug abuse Longest period of sobriety (when/how long): NA                    Allergies:  No Known Allergies Lab Results:  Results for orders placed or performed during the hospital encounter of 07/30/17 (from the past 48 hour(s))   Urinalysis, Complete w Microscopic     Status: Abnormal   Collection Time: 07/30/17  9:47 PM  Result Value Ref Range   Color, Urine YELLOW (A) YELLOW   APPearance TURBID (A) CLEAR   Specific Gravity, Urine 1.029 1.005 - 1.030   pH 5.0 5.0 - 8.0   Glucose, UA NEGATIVE NEGATIVE mg/dL   Hgb urine dipstick NEGATIVE NEGATIVE   Bilirubin Urine NEGATIVE NEGATIVE   Ketones, ur NEGATIVE NEGATIVE mg/dL   Protein, ur NEGATIVE NEGATIVE mg/dL   Nitrite NEGATIVE NEGATIVE   Leukocytes, UA NEGATIVE NEGATIVE   RBC / HPF NONE SEEN 0 - 5 RBC/hpf   WBC, UA NONE SEEN 0 - 5 WBC/hpf   Bacteria, UA FEW (A) NONE SEEN   Squamous Epithelial / LPF NONE SEEN NONE SEEN    Comment: Performed at [name] [place] [address] [place], [place] [address]  Urine rapid drug screen (hosp performed)not at [facility]     Status: Abnormal   Collection Time: 07/30/17  9:47 PM  Result Value Ref Range   Opiates NONE DETECTED NONE DETECTED   Cocaine NONE DETECTED NONE DETECTED   Benzodiazepines NONE DETECTED NONE DETECTED   Amphetamines NONE DETECTED NONE DETECTED   Tetrahydrocannabinol POSITIVE (A) NONE DETECTED  Barbiturates NONE DETECTED NONE DETECTED    Comment: (NOTE) DRUG SCREEN FOR MEDICAL PURPOSES ONLY.  IF CONFIRMATION IS NEEDED FOR ANY PURPOSE, NOTIFY LAB WITHIN 5 DAYS. LOWEST DETECTABLE LIMITS FOR URINE DRUG SCREEN Drug Class                     Cutoff (ng/mL) Amphetamine and metabolites    1000 Barbiturate and metabolites    200 Benzodiazepine                 834 Tricyclics and metabolites     300 Opiates and metabolites        300 Cocaine and metabolites        300 THC                            50 Performed at [facility], [place] [address], [place], [place] [address]   CBC     Status: None   Collection Time: 07/31/17  6:55 AM  Result Value Ref Range   WBC 6.8 4.0 - 10.5 K/uL   RBC 5.57 4.22 - 5.81 MIL/uL   Hemoglobin 16.2 13.0 - 17.0 g/dL   HCT 46.7 39.0 - 52.0  %   MCV 83.8 78.0 - 100.0 fL   MCH 29.1 26.0 - 34.0 pg   MCHC 34.7 30.0 - 36.0 g/dL   RDW 13.3 11.5 - 15.5 %   Platelets 225 150 - 400 K/uL    Comment: Performed at [facility], [place] [address], [place], [place] [address]  Comprehensive metabolic panel     Status: Abnormal   Collection Time: 07/31/17  6:55 AM  Result Value Ref Range   Sodium 139 135 - 145 mmol/L   Potassium 4.1 3.5 - 5.1 mmol/L   Chloride 104 101 - 111 mmol/L   CO2 25 22 - 32 mmol/L   Glucose, Bld 110 (H) 65 - 99 mg/dL   BUN 12 6 - 20 mg/dL   Creatinine, Ser 1.03 0.61 - 1.24 mg/dL   Calcium 9.5 8.9 - 10.3 mg/dL   Total Protein 8.0 6.5 - 8.1 g/dL   Albumin 4.4 3.5 - 5.0 g/dL   AST 31 15 - 41 U/L   ALT 46 17 - 63 U/L   Alkaline Phosphatase 59 38 - 126 U/L   Total Bilirubin 0.8 0.3 - 1.2 mg/dL   GFR calc non Af Amer >60 >60 mL/min   GFR calc Af Amer >60 >60 mL/min    Comment: (NOTE) The eGFR has been calculated using the CKD EPI equation. This calculation has not been validated in all clinical situations. eGFR's persistently <60 mL/min signify possible Chronic Kidney Disease.    Anion gap 10 5 - 15    Comment: Performed at [facility], [place] [address] [place], [place] [address]  Hemoglobin A1c     Status: None   Collection Time: 07/31/17  6:55 AM  Result Value Ref Range   Hgb A1c MFr Bld 5.0 4.8 - 5.6 %    Comment: (NOTE) Pre diabetes:          5.7%-6.4% Diabetes:              >6.4% Glycemic control for   <7.0% adults with diabetes    Mean Plasma Glucose 96.8 mg/dL    Comment: Performed at [facility] [address], [place], [place] [address]  Ethanol  Status: None   Collection Time: 07/31/17  6:55 AM  Result Value Ref Range   Alcohol, Ethyl (B) <10 <10 mg/dL    Comment:        LOWEST DETECTABLE LIMIT FOR SERUM ALCOHOL IS 10 mg/dL FOR MEDICAL PURPOSES ONLY Performed at [facility] [address], [place], [place] [address]    Lipid panel     Status: Abnormal   Collection Time: 07/31/17  6:55 AM  Result Value Ref Range   Cholesterol 149 0 - 200 mg/dL   Triglycerides 116 <150 mg/dL   HDL 36 (L) >40 mg/dL   Total CHOL/HDL Ratio 4.1 RATIO   VLDL 23 0 - 40 mg/dL   LDL Cholesterol 90 0 - 99 mg/dL    Comment:        Total Cholesterol/HDL:CHD Risk Coronary Heart Disease Risk Table                     Men   Women  1/2 Average Risk   3.4   3.3  Average Risk       5.0   4.4  2 X Average Risk   9.6   7.1  3 X Average Risk  23.4   11.0        Use the calculated Patient Ratio above and the CHD Risk Table to determine the patient's CHD Risk.        ATP III CLASSIFICATION (LDL):  <100     mg/dL   Optimal  100-129  mg/dL   Near or Above                    Optimal  130-159  mg/dL   Borderline  160-189  mg/dL   High  >190     mg/dL   Very High Performed at [facility] [address], [place], [place] [address]   TSH     Status: None   Collection Time: 07/31/17  6:55 AM  Result Value Ref Range   TSH 1.092 0.350 - 4.500 uIU/mL    Comment: Performed by a 3rd Generation assay with a functional sensitivity of <=0.01 uIU/mL. Performed at [facility], [place] [address] [place], [place] [address]     Blood Alcohol level:  Lab Results  Component Value Date   ETH <10 07/31/2017   ETH <5 64/33/2951    Metabolic Disorder Labs:  Lab Results  Component Value Date   HGBA1C 5.0 07/31/2017   MPG 96.8 07/31/2017   MPG 103 11/19/2016   Lab Results  Component Value Date   PROLACTIN 60.4 (H) 11/19/2016   Lab Results  Component Value Date   CHOL 149 07/31/2017   TRIG 116 07/31/2017   HDL 36 (L) 07/31/2017   CHOLHDL 4.1 07/31/2017   VLDL 23 07/31/2017   LDLCALC 90 07/31/2017   LDLCALC 52 11/19/2016    Current Medications: Current Facility-Administered Medications  Medication Dose Route Frequency Provider Last Rate Last Dose  . acetaminophen (TYLENOL) tablet 650 mg   650 mg Oral Q6H PRN Rankin, Shuvon B, NP      . alum & mag hydroxide-simeth (MAALOX/MYLANTA) 200-200-20 MG/5ML suspension 30 mL  30 mL Oral Q4H PRN Rankin, Shuvon B, NP      . magnesium hydroxide (MILK OF MAGNESIA) suspension 30 mL  30 mL Oral Daily PRN Rankin, Shuvon B, NP      . traZODone (DESYREL) tablet 50 mg  50 mg Oral QHS,MR  X 1 Laverle Hobby, PA-C   50 mg at 07/30/17 2243   PTA Medications: Medications Prior to Admission  Medication Sig Dispense Refill Last Dose  . Omega-3 Fatty Acids (OMEGA 3 PO) Take 1 capsule by mouth daily.   07/29/2017    Musculoskeletal: Strength & Muscle Tone: within normal limits Gait & Station: normal Patient leans: N/A  Psychiatric Specialty Exam: Physical Exam  Nursing note and vitals reviewed. Constitutional: He is oriented to person, place, and time. He appears well-developed and well-nourished.  Cardiovascular: Normal rate.  Respiratory: Effort normal.  Musculoskeletal: Normal range of motion.  Neurological: He is alert and oriented to person, place, and time.  Skin: Skin is warm.    Review of Systems  Constitutional: Negative.   HENT: Negative.   Eyes: Negative.   Respiratory: Negative.   Cardiovascular: Negative.   Gastrointestinal: Negative.   Genitourinary: Negative.   Musculoskeletal: Negative.   Skin: Negative.   Neurological: Negative.   Endo/Heme/Allergies: Negative.   Psychiatric/Behavioral: Positive for depression. Negative for hallucinations, substance abuse and suicidal ideas. The patient is nervous/anxious. The patient does not have insomnia.     Blood pressure 123/75, pulse 91, temperature 98.3 F (36.8 C), temperature source Oral, resp. rate 16, height 6' 3"  (1.905 m), weight (!) 139.7 kg (308 lb), SpO2 98 %.Body mass index is 38.5 kg/m.  General Appearance: Casual and Fairly Groomed  Eye Contact:  Good  Speech:  Clear and Coherent and Normal Rate  Volume:  Normal  Mood:  Depressed  Affect:  Flat  Thought Process:   Goal Directed and Descriptions of Associations: Intact  Orientation:  Full (Time, Place, and Person)  Thought Content:  WDL  Suicidal Thoughts:  No not currently  Homicidal Thoughts:  No  Memory:  Immediate;   Good Recent;   Good Remote;   Good  Judgement:  Good  Insight:  Good  Psychomotor Activity:  Normal  Concentration:  Concentration: Good and Attention Span: Good  Recall:  Good  Fund of Knowledge:  Good  Language:  Good  Akathisia:  No  Handed:  Right  AIMS (if indicated):     Assets:  Communication Skills Desire for Improvement Financial Resources/Insurance Housing Physical Health Social Support Transportation  ADL's:  Intact  Cognition:  WNL  Sleep:  Number of Hours: 5.25    Treatment Plan Summary: Daily contact with patient to assess and evaluate symptoms and progress in treatment, Medication management and Plan is to:  -See SRA and MAR for medication management -Encourage group therapy participation  Observation Level/Precautions:  15 minute checks  Laboratory:  Reviewed  Psychotherapy:  Group therapy  Medications:  See [facility]  Consultations:  As needed  Discharge Concerns:  Compliance  Estimated LOS: 3-5 Days  Other:  Admit to [address] but program on [place] for Primary Diagnosis: Bipolar 1 disorder, depressed, severe ([place]) Long Term Goal(s): Improvement in symptoms so as ready for discharge  Short Term Goals: Ability to verbalize feelings will improve, Ability to disclose and discuss suicidal ideas and Ability to demonstrate self-control will improve  Physician Treatment Plan for Secondary Diagnosis: Principal Problem:   Bipolar 1 disorder, depressed, severe ([place])  Long Term Goal(s): Improvement in symptoms so as ready for discharge  Short Term Goals: Ability to identify and develop effective coping behaviors will improve and Compliance with prescribed medications will improve  I certify that inpatient services furnished  can reasonably be expected to improve the patient's condition.  Lewis Shock, FNP 3/7/20191:59 PM   I have discussed case with NP and have met with patient  Agree with NP note and assessment  37 year old male, presented to the hospital voluntarily due to worsening depression. States he has a long history of depression, which has been worse over recent weeks. Reports neuro-vegetative symptoms       ( sadness, anhedonia, low energy level , subjective sense of hopelessness  and passive suicidal ideations. ). Reports psychosocial stressors contributing to his depression- oldest son has autism spectrum disorder, states marital relationship is poor and that they are considering divorce, wife has medical issues (chronic pain), and also reports that his job is boring and unrewarding . Of note has not been on psychiatric medications x several months.  Denies drug or alcohol abuse .  Patient has been diagnosed with Bipolar Disorder , and reports prior admission in June 2018 for manic decompensation. At the time was discharged on Zyprexa , Lithium. States he had been in the process of changing medications due primarily to feeling more blunted and due to weight gain- was tried on Abilify, but he tolerated it poorly . States he then decided to completely come off medications.  Denies medical illnesses . NKDA.  Dx- Bipolar Disorder, Depressed   Plan - We discussed options , patient reluctant to consider medications that may be associated often with weight gain. Agrees to [place] and Lamictal for treatment . Latuda 20 mgrs QDAY Lamictal 25 mgrs QDAY

## 2017-07-31 NOTE — BHH Group Notes (Signed)
Adult Psychoeducational Group Note  Date:  07/31/2017 Time:  1:04 AM  Group Topic/Focus:  Wrap-Up Group:   The focus of this group is to help patients review their daily goal of treatment and discuss progress on daily workbooks.  Participation Level:  Minimal  Participation Quality:  Attentive  Affect:  Flat  Cognitive:  Alert and Oriented  Insight: Lacking  Engagement in Group:  Developing/Improving  Modes of Intervention:  Clarification, Exploration and Support  Additional Comments:  Pt verbalized that he rated his day a 2. Pt stated that one positive is that he removed himself from a destructive environment. Pt verbalized that tomorrow he would like to work on forgiving himself and meet with one of the doctors.  Hadassa Cermak, Randal Bubaerri Lee 07/31/2017, 1:04 AM

## 2017-07-31 NOTE — B[facility] Suicide Risk Assessment (Signed)
[facility] Admission Suicide Risk Assessment   Nursing information obtained from:   patient and chart Demographic factors:   37 year old married male, has two children ( 4,2), employed.  Current Mental Status:   See below Loss Factors:    Historical Factors:   has had two prior psychiatric admissions, most recently last year ( June 2018). He has history of Bipolar Disorder diagnosis, and was admitted in in June 2018 due to manic presentation. At the time was prescribed Zyprexa, Lithium Risk Reduction Factors:   resilience, sense of responsibility to family   Total Time spent with patient: 45 minutes Principal Problem: Bipolar 1 disorder, depressed, severe ([facility]) Diagnosis:   Patient Active Problem List   Diagnosis Date Noted  . Bipolar 1 disorder, depressed, severe ([facility]) [F31.4] 07/30/2017  . 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 Bipolar disord, crnt episode mixed, severe, w psych features ([facility]) [F31.64] 11/12/2016  . Acute cholecystitis [K81.0] 10/03/2015     Continued Clinical Symptoms:  Alcohol Use Disorder Identification Test Final Score (AUDIT): 0 The "Alcohol Use Disorders Identification Test", Guidelines for Use in Primary Care, Second Edition.  World Science writer [facility]). Score between 0-7:  no or low risk or alcohol related problems. Score between 8-15:  moderate risk of alcohol related problems. Score between 16-19:  high risk of alcohol related problems. Score 20 or above:  warrants further diagnostic evaluation for alcohol dependence and treatment.   CLINICAL FACTORS:  37 year old male, presented to the hospital voluntarily due to worsening depression. States he has a long history of depression, which has been worse over recent weeks. Reports neuro-vegetative symptoms       ( sadness, anhedonia, low energy level , subjective sense of hopelessness  and passive suicidal ideations. ). Reports psychosocial stressors contributing to his depression- oldest son  has autism spectrum disorder, states marital relationship is poor and that they are considering divorce, wife has medical issues (chronic pain), and also reports that his job is boring and unrewarding . Of note has not been on psychiatric medications x several months.  Denies drug or alcohol abuse .  Patient has been diagnosed with Bipolar Disorder , and reports prior admission in June 2018 for manic decompensation. At the time was discharged on Zyprexa , Lithium. States he had been in the process of changing medications due primarily to feeling more blunted and due to weight gain- was tried on Abilify, but he tolerated it poorly . States he then decided to completely come off medications.  Denies medical illnesses . NKDA.  Dx- Bipolar Disorder, Depressed   Plan - We discussed options , patient reluctant to consider medications that may be associated often with weight gain. Agrees to [place] and Lamictal for treatment . Latuda 20 mgrs QDAY Lamictal 25 mgrs QDAY       Musculoskeletal: Strength & Muscle Tone: within normal limits Gait & Station: normal Patient leans: N/A  Psychiatric Specialty Exam: Physical Exam  ROS mild headache, no chest pain, no shortness of breath, no vomiting   Blood pressure 123/75, pulse 91, temperature 98.3 F (36.8 C), temperature source Oral, resp. rate 16, height 6\' 3"  (1.905 m), weight (!) 139.7 kg (308 lb), SpO2 98 %.Body mass index is 38.5 kg/m.  General Appearance: Well Groomed  Eye Contact:  Fair  Speech:  Normal Rate  Volume:  Decreased  Mood:  Depressed  Affect:  Constricted and Flat  Thought Process:  Linear and Descriptions of Associations: Intact  Orientation:  Full (Time, Place, and Person)  Thought Content:  denies hallucinations, no delusions, not internally preoccupied   Suicidal Thoughts:  No denies any suicidal or self injurious ideations, denies any homicidal or violent ideations   Homicidal Thoughts:  No  Memory:  recent and remote  grossly intact   Judgement:  Fair  Insight:  Fair  Psychomotor Activity:  Decreased  Concentration:  Concentration: Good and Attention Span: Good  Recall:  Good  Fund of Knowledge:  Good  Language:  Good  Akathisia:  Negative  Handed:  Right  AIMS (if indicated):     Assets:  Communication Skills Desire for Improvement Resilience  ADL's:  Intact  Cognition:  WNL  Sleep:  Number of Hours: 5.25      COGNITIVE FEATURES THAT CONTRIBUTE TO RISK:  Closed-mindedness and Loss of executive function    SUICIDE RISK:   Moderate:  Frequent suicidal ideation with limited intensity, and duration, some specificity in terms of plans, no associated intent, good self-control, limited dysphoria/symptomatology, some risk factors present, and identifiable protective factors, including available and accessible social support.  PLAN OF CARE: Patient will be admitted to inpatient psychiatric unit for stabilization and safety. Will provide and encourage milieu participation. Provide medication management and maked adjustments as needed.  Will follow daily.    I certify that inpatient services furnished can reasonably be expected to improve the patient's condition.   Craige CottaFernando A Cobos, MD 07/31/2017, 2:29 PM

## 2017-07-31 NOTE — [facility] Suicide Risk Assessment (Signed)
[facility] INPATIENT:  Family/Significant Other Suicide Prevention Education  Suicide Prevention Education:   Patient Refusal for Family/Significant Other Suicide Prevention Education: The patient Ronald Fritz has refused to provide written consent for family/significant other to be provided Family/Significant Other Suicide Prevention Education during admission and/or prior to discharge.  Physician notified.  Maeola SarahJolan E Leeah Politano 07/31/2017, 11:47 AM

## 2017-07-31 NOTE — [facility] Counselor (Signed)
Adult Comprehensive Assessment  Patient ID: Ria BushDavid Kirchoff, male   DOB: Oct 06, 1980, 37 y.o.   MRN: [id number]   Information Source: Patient   Current Stressors:  Educational / Learning stressors: Patient denies  Employment / Job issues: Patient reports struggling with balancing his work and personal life.   Family Relationships:Patient reports having a strained, "toxic" relationship with his wife. Reports many issues that include, financial stressors and personal stressors.  Financial / Lack of resources (include bankruptcy): "Strained" Housing / Lack of housing: NA Physical health (include injuries & life threatening diseases): Patient denies Social relationships: Patient denies, reports that he does not have any social relationships outside of his family.  Substance abuse: Patient denies Bereavement / Loss: Patient denies  Living/Environment/Situation:  Living Arrangements: Spouse/significant other Living conditions (as described by patient or guardian): "Good" How long has patient lived in current situation?: 2 years What is atmosphere in current home: Comfortable, Loving, Supportive  Family History:  Marital status: Married Number of Years Married: 4 What types of issues is patient dealing with in the relationship?: Financial issues, personal issues. Patient reports he and his wife have a "toxic" relationship and that they are too "broke" to separate.   Additional relationship information: Patient reports no issues other than financial stress and stress of two young children.  Are you sexually active?: Yes What is your sexual orientation?: Heterosexual Has your sexual activity been affected by drugs, alcohol, medication, or emotional stress?: "I don't believe so" Does patient have children?: Yes How many children?: 2 How is patient's relationship with their children?: Good with both 2 YO and 4 YO  Childhood History:  By whom was/is the patient raised?: Mother Additional  childhood history information: Father died when pt was 4 YO Description of patient's relationship with caregiver when they were a child: Good with mother Patient's description of current relationship with people who raised him/her: Good with mother How were you disciplined when you got in trouble as a child/adolescent?: "I was never disciplined; good kid" Does patient have siblings?: Yes Number of Siblings: 2 Description of patient's current relationship with siblings: Not close to brother and sister who are in [place] Did patient suffer any verbal/emotional/physical/sexual abuse as a child?: No Did patient suffer from severe childhood neglect?: No Has patient ever been sexually abused/assaulted/raped as an adolescent or adult?: No Was the patient ever a victim of a crime or a disaster?: No Witnessed domestic violence?: No Has patient been effected by domestic violence as an adult?: No  Education:  Highest grade of school patient has completed: Some college Currently a Consulting civil engineerstudent?: No Learning disability?: No  Employment/Work Situation:   Employment situation: Employed Where is patient currently employed?: [employer] How long has patient been employed?: 3 years Patient's job has been impacted by current illness: Yes Describe how patient's job has been impacted: "It's been difficult with work life balance" What is the longest time patient has a held a job?: 3 years Where was the patient employed at that time?: current job Has patient ever been in the Eli Lilly and Companymilitary?: No Has patient ever served in combat?: No  Financial Resources:   Financial resources: Income from employment Does patient have a representative payee or guardian?: No  Alcohol/Substance Abuse:   What has been your use of drugs/alcohol within the last 12 months?: THC "Maybe once a month" Alcohol/Substance Abuse Treatment Hx: Denies past history Has alcohol/substance abuse ever caused legal problems?: No  Social Support System:    Conservation officer, natureatient's Community Support System: Fair Describe  Community Support System: Wife and mother Type of faith/religion: Ephriam Knuckles How does patient's faith help to cope with current illness?: "It;s my goal post"  Leisure/Recreation:   Leisure and Hobbies: "Music, movies, books"  Strengths/Needs:   What things does the patient do well?: Primary school teacher; 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 He presented to the hospital seeking treatment for depressive symptoms and medication management. During the assessment, Kem was pleasant and cooperative with providing information. Dorsie reports that he came into the hospital because he experienced an increase in depressive symptoms such as hopelessness and feeling overwhelmed. 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 Earlene Plater reports he follows up with [facility] for therapy services. Daimion requested that if he is placed on medications during his hospitalization, he would like to be referred to an outpatient psychiatrist at discharge. Clanton can benefit from crisis stabilization, medication management,therapeutic milieu and referral services.  Maeola Sarah. 07/31/2017

## 2017-07-31 NOTE — BH Assessment (Signed)
Assessment Note  Ronald BushDavid Fritz is an 37 y.o. male. Pt reports SI with no plan. Pt denies Hi/AVH. Pt states he cannot contract for safety. Pt reports 2 prior hospitalizations. Pt states he has been diagnosed with depression. Pt states he currently sees a therapist. Pt denies current medication management. Pt reports worsening depression. Pt states he is married but his marriage is currently strained. Pt denies current family/friend support. Pt denies SA.  Shuvon, NP recommends inpatient treatment. [facility]  Diagnosis:  F33.2 MDD  Past Medical History: History reviewed. No pertinent past medical history.  Past Surgical History:  Procedure Laterality Date  . CHOLECYSTECTOMY N/A 10/04/2015   Procedure: LAPAROSCOPIC CHOLECYSTECTOMY;  Surgeon: Axel FillerArmando Ramirez, MD;  Location: [facility] OR;  Service: General;  Laterality: N/A;    Family History: History reviewed. No pertinent family history.  Social History:  reports that  has never smoked. he has never used smokeless tobacco. He reports that he does not drink alcohol or use drugs.  Additional Social History:  Alcohol / Drug Use Pain Medications: please see mar Prescriptions: please see mar Over the Counter: please see mar History of alcohol / drug use?: No history of alcohol / drug abuse Longest period of sobriety (when/how long): NA  CIWA: CIWA-Ar BP: 132/80 Pulse Rate: 70 COWS:    Allergies: No Known Allergies  Home Medications:  Medications Prior to Admission  Medication Sig Dispense Refill  . benztropine (COGENTIN) 0.5 MG tablet Take 1 tablet (0.5 mg total) by mouth 2 (two) times daily in the am and at bedtime.. For prevention of drug induced tremors 60 tablet 0  . lithium carbonate (ESKALITH) 450 MG CR tablet Take 1 tablet (450 mg total) by mouth 2 (two) times daily. For mood stabilization 60 tablet 0  . lithium carbonate (LITHOBID) 300 MG CR tablet Take 1 tablet (300 mg total) by mouth at bedtime. For mood stabilization 30  tablet 0  . OLANZapine (ZYPREXA) 10 MG tablet Take 1 tablet (10 mg total) by mouth 2 (two) times daily in the am and at bedtime.. For mood control 60 tablet 0  . traZODone (DESYREL) 150 MG tablet Take 1 tablet (150 mg total) by mouth at bedtime. For sleep 30 tablet 0    OB/GYN Status:  No LMP for male patient.  General Assessment Data [facility] TTS Assessment: In system Is this a Tele or Face-to-Face Assessment?: Face-to-Face Is this an Initial Assessment or a Re-assessment for this encounter?: Initial Assessment Marital status: Married [place]iden name: NA Is patient pregnant?: No Pregnancy Status: No Living Arrangements: Spouse/significant other Can pt return to current living arrangement?: Yes Admission Status: Voluntary Is patient capable of signing voluntary admission?: Yes Referral Source: Self/Family/Friend Insurance type: Scientist, research (physical sciences)BCBS  Medical Screening Exam [facility] Walk-in ONLY) Medical Exam completed: Yes  Crisis Care Plan Living Arrangements: Spouse/significant other Legal Guardian: Other:(self) Name of Psychiatrist: NA Name of Therapist: NA  Education Status Is patient currently in school?: No Is the patient employed, unemployed or receiving disability?: Employed  Risk to self with the past 6 months Suicidal Ideation: Yes-Currently Present Has patient been a risk to self within the past 6 months prior to admission? : Yes Suicidal Intent: Yes-Currently Present Has patient had any suicidal intent within the past 6 months prior to admission? : Yes Is patient at risk for suicide?: Yes Suicidal Plan?: No-Not Currently/Within Last 6 Months Has patient had any suicidal plan within the past 6 months prior to admission? : No  Access to Means: Yes Specify Access to Suicidal Means: access to pills What has been your use of drugs/alcohol within the last 12 months?: NA Previous Attempts/Gestures: Yes How many times?: 1 Other Self Harm Risks:  NA Triggers for Past Attempts: None known Intentional Self Injurious Behavior: None Family Suicide History: No Recent stressful life event(s): Other (Comment)(unknown) Persecutory voices/beliefs?: No Depression: Yes Depression Symptoms: Despondent, Tearfulness, Isolating, Guilt, Loss of interest in usual pleasures, Feeling worthless/self pity, Feeling angry/irritable Substance abuse history and/or treatment for substance abuse?: No Suicide prevention information given to non-admitted patients: Not applicable  Risk to Others within the past 6 months Homicidal Ideation: No Does patient have any lifetime risk of violence toward others beyond the six months prior to admission? : No Thoughts of Harm to Others: No Current Homicidal Intent: No Current Homicidal Plan: No Access to Homicidal Means: No Identified Victim: NA History of harm to others?: No Assessment of Violence: None Noted Violent Behavior Description: NA Does patient have access to weapons?: No Criminal Charges Pending?: No Does patient have a court date: No Is patient on probation?: No  Psychosis Hallucinations: None noted Delusions: None noted  Mental Status Report Appearance/Hygiene: Unremarkable Eye Contact: Fair Motor Activity: Freedom of movement Speech: Logical/coherent Level of Consciousness: Alert Mood: Depressed Affect: Appropriate to circumstance Anxiety Level: Moderate Thought Processes: Coherent, Relevant Judgement: Unimpaired Orientation: Person, Place, Time, Situation Obsessive Compulsive Thoughts/Behaviors: None  Cognitive Functioning Concentration: Normal Memory: Recent Intact, Remote Intact Is patient IDD: No Is patient DD?: No Insight: Fair Impulse Control: Fair Appetite: Fair Have you had any weight changes? : No Change Sleep: No Change Total Hours of Sleep: 8 Vegetative Symptoms: None  ADLScreening [facility] Assessment Services) Patient's cognitive ability adequate to safely complete  daily activities?: Yes Patient able to express need for assistance with ADLs?: Yes Independently performs ADLs?: Yes (appropriate for developmental age)  Prior Inpatient Therapy Prior Inpatient Therapy: Yes Prior Therapy Dates: 2018 Prior Therapy Facilty/Provider(s): [facility] Reason for Treatment: depression  Prior Outpatient Therapy Prior Outpatient Therapy: Yes Prior Therapy Dates: 2018 Prior Therapy Facilty/Provider(s): NA Reason for Treatment: nA Does patient have an ACCT team?: No Does patient have Intensive In-House Services?  : No Does patient have [facility] services? : No Does patient have P4CC services?: No  ADL Screening (condition at time of admission) Patient's cognitive ability adequate to safely complete daily activities?: Yes Is the patient deaf or have difficulty hearing?: No Does the patient have difficulty seeing, even when wearing glasses/contacts?: No Does the patient have difficulty concentrating, remembering, or making decisions?: No Patient able to express need for assistance with ADLs?: Yes Does the patient have difficulty dressing or bathing?: No Independently performs ADLs?: Yes (appropriate for developmental age) Does the patient have difficulty walking or climbing stairs?: No Weakness of Legs: None Weakness of Arms/Hands: None  Home Assistive Devices/Equipment Home Assistive Devices/Equipment: None  Therapy Consults (therapy consults require a physician order) PT Evaluation Needed: No OT Evalulation Needed: No SLP Evaluation Needed: No Abuse/Neglect Assessment (Assessment to be complete while patient is alone) Abuse/Neglect Assessment Can Be Completed: Yes Physical Abuse: Denies Verbal Abuse: Denies Sexual Abuse: Denies Exploitation of patient/patient's resources: Denies Self-Neglect: Denies Values / Beliefs Cultural Requests During Hospitalization: None Spiritual Requests During Hospitalization: None Consults Spiritual Care Consult Needed:  No Social Work Consult Needed: No Merchant navy officer (For Healthcare) Does Patient Have a Medical Advance Directive?: No Would patient like information on creating a medical advance directive?: No - Patient declined Nutrition Screen- [facility] Adult/[facility]  Patient's home diet: Regular Has the patient recently lost weight without trying?: No Has the patient been eating poorly because of a decreased appetite?: No Malnutrition Screening Tool Score: 0  Additional Information 1:1 In Past 12 Months?: No CIRT Risk: No Elopement Risk: No Does patient have medical clearance?: Yes     Disposition:  Disposition Initial Assessment Completed for this Encounter: Yes Disposition of Patient: Admit Type of inpatient treatment program: Adult Patient refused recommended treatment: No  On Site Evaluation by:   Reviewed with Physician:    Ezekial Arns D 07/31/2017 7:20 AM

## 2017-07-31 NOTE — [facility] Group Notes (Signed)
[facility] LCSW Group Therapy Note  Date/Time: 07/31/17, 1315  Type of Therapy/Topic:  Group Therapy:  Balance in Life  Participation Level:  mpderate  Description of Group:    This group will address the concept of balance and how it feels and looks when one is unbalanced. Patients will be encouraged to process areas in their lives that are out of balance, and identify reasons for remaining unbalanced. Facilitators will guide patients utilizing problem- solving interventions to address and correct the stressor making their life unbalanced. Understanding and applying boundaries will be explored and addressed for obtaining  and maintaining a balanced life. Patients will be encouraged to explore ways to assertively make their unbalanced needs known to significant others in their lives, using other group members and facilitator for support and feedback.  Therapeutic Goals: 1. Patient will identify two or more emotions or situations they have that consume much of in their lives. 2. Patient will identify signs/triggers that life has become out of balance:  3. Patient will identify two ways to set boundaries in order to achieve balance in their lives:  4. Patient will demonstrate ability to communicate their needs through discussion and/or role plays  Summary of Patient Progress: Pt shared that family/marriage is out of balance currently.  Pt was not overly active in group discussion but did respond to CSW questions and participated in an appropriate way.          Therapeutic Modalities:   Cognitive Behavioral Therapy Solution-Focused Therapy Assertiveness Training  Daleen SquibbGreg Brandin Stetzer, [place]

## 2017-07-31 NOTE — Progress Notes (Signed)
Nutrition Education Note  Pt attended group focusing on general, healthful nutrition education.  RD emphasized the importance of eating regular meals and snacks throughout the day. Consuming sugar-free beverages and incorporating fruits and vegetables into diet when possible. Provided examples of healthy snacks. Patient encouraged to leave group with a goal to improve nutrition/healthy eating.   Diet Order: Diet regular Room service appropriate? Yes; Fluid consistency: Thin Pt is also offered choice of unit snacks mid-morning and mid-afternoon.  Pt is eating as desired.   If additional nutrition issues arise, please consult RD.    Telena Peyser, MS, RD, LDN, CNSC Inpatient Clinical Dietitian Pager # [phone] After hours/weekend pager # [phone]     

## 2017-07-31 NOTE — Progress Notes (Addendum)
Psychoeducational Group Note  Date:  07/31/2017 Time:  0900  Group Topic/Focus:  Goals Group:   The focus of this group is to help patients establish daily goals to achieve during treatment and discuss how the patient can incorporate goal setting into their daily lives to aide in recovery.  Participation Level: Did Not Attend  Participation Quality:  Not Applicable  Affect:  Not Applicable  Cognitive:  Not Applicable  Insight:  Not Applicable  Engagement in Group: Not Applicable  Additional Comments:  Pt program on the 400 hall, pt didn't attend group on 300 hall   Gwenevere Ghazili, Patirica Longshore Patience 07/31/2017, 10:13 AM

## 2017-07-31 NOTE — [facility] Suicide Risk Assessment (Signed)
[facility] INPATIENT:  Family/Significant Other Suicide Prevention Education  Suicide Prevention Education:   SPE completed with patient, as patient refused to consent to family contact. SPI pamphlet provided to pt and pt was encouraged to share information with support network, ask questions, and talk about any concerns relating to SPE. Patient denies access to guns/firearms and verbalized understanding of information provided. Mobile Crisis information also provided to patient.   

## 2017-08-01 NOTE — Progress Notes (Signed)
Adult Psychoeducational Group Note  Date:  08/01/2017 Time:  8:59 PM  Group Topic/Focus:  Wrap-Up Group:   The focus of this group is to help patients review their daily goal of treatment and discuss progress on daily workbooks.  Participation Level:  Active  Participation Quality:  Appropriate  Affect:  Appropriate  Cognitive:  Alert and Oriented  Insight: Good  Engagement in Group:  Engaged  Modes of Intervention:  Discussion  Additional Comments:  Pt rated his day a 5/10. His goal was to read to pass the time, which he did. Pt does not have a goal for tomorrow.   Leo GrosserMegan A Raeshaun Simson 08/01/2017, 8:59 PM

## 2017-08-01 NOTE — Plan of Care (Signed)
Pt is taking medications as prescribed with no complaints.

## 2017-08-01 NOTE — Progress Notes (Signed)
Nursing Progress Note 1900-0730  D) Patient presents pleasant and cooperative. On initial approach, patient was resting in bed to go to sleep. Patient did attend group. Patient denies SI/HI/AVH or pain. Patient contracts for safety on the unit. Patient did not request scheduled Trazodone for sleep. Patient denied concerns for writer this evening. Patient in no acute distress.  A) Patient educated medication available per provider's orders. Patient safety maintained with q15 min safety checks. Low fall risk precautions in place. Emotional support given. 1:1 interaction and active listening provided. Snacks and fluids provided. Labs, vital signs and patient behavior monitored throughout shift. Patient encouraged to work on treatment plan.  R) Patient remains safe on the unit at this time. Patient agrees to make needs known to staff. Patient is currently sleeping in bed. Will continue to monitor and assess for changes.

## 2017-08-01 NOTE — Progress Notes (Signed)
Adult Psychoeducational Group Note  Date:  08/01/2017 Time:  0830 Group Topic/Focus:  Goals Group:   The focus of this group is to help patients establish daily goals to achieve during treatment and discuss how the patient can incorporate goal setting into their daily lives to aide in recovery. Orientation:   The focus of this group is to educate the patient on the purpose and policies of crisis stabilization and provide a format to answer questions about their admission.  The group details unit policies and expectations of patients while admitted.  Participation Level:  Minimal  Participation Quality:  Appropriate  Affect:  Appropriate  Cognitive:  Appropriate  Insight: Appropriate  Engagement in Group:  Engaged  Modes of Intervention:  Discussion, Rapport Building and Support  Additional Comments:  Pt attended goal/orientation group, pt appears to be attentive while Clinical research associatewriter was speaking   Gwenevere Ghazili, Lethia Donlon Patience 08/01/2017, 9:09 AM

## 2017-08-01 NOTE — Progress Notes (Signed)
Pt has a flat blunted affect. He reports that he is starting to feel better and currently denies si and hi thoughts. He says that he plans to separate and divorce his wife following discharge. Pt requested printed handouts with information about his medications. He is participating on the unit. A:Offered support, encouragement and 15 minute checks. Gave handouts as requested.  R:Pt denies si and hi. Safety maintained on the unit.

## 2017-08-01 NOTE — Tx Team (Signed)
Interdisciplinary Treatment and Diagnostic Plan Update  08/01/2017 Time of Session: 1140 Ronald Fritz MRN: [id number]  Principal Diagnosis: Bipolar 1 disorder, depressed, severe (HCC)  Secondary Diagnoses: Principal Problem:   Bipolar 1 disorder, depressed, severe (HCC)   Current Medications:  Current Facility-Administered Medications  Medication Dose Route Frequency Provider Last Rate Last Dose  . acetaminophen (TYLENOL) tablet 650 mg  650 mg Oral Q6H PRN Rankin, Shuvon B, NP      . alum & mag hydroxide-simeth (MAALOX/MYLANTA) 200-200-20 MG/5ML suspension 30 mL  30 mL Oral Q4H PRN Rankin, Shuvon B, NP      . lamoTRIgine (LAMICTAL) tablet 25 mg  25 mg Oral Daily Cobos, Rockey Situ, MD   25 mg at 08/01/17 0834  . LORazepam (ATIVAN) tablet 0.5 mg  0.5 mg Oral Q6H PRN Cobos, Fernando A, MD      . lurasidone (LATUDA) tablet 20 mg  20 mg Oral Q breakfast Cobos, Rockey Situ, MD   20 mg at 08/01/17 0834  . magnesium hydroxide (MILK OF MAGNESIA) suspension 30 mL  30 mL Oral Daily PRN Rankin, Shuvon B, NP      . traZODone (DESYREL) tablet 50 mg  50 mg Oral QHS,MR X 1 Simon, Spencer E, PA-C   50 mg at 07/30/17 2243   PTA Medications: Medications Prior to Admission  Medication Sig Dispense Refill Last Dose  . Omega-3 Fatty Acids (OMEGA 3 PO) Take 1 capsule by mouth daily.   07/29/2017    Patient Stressors: Marital or family conflict Medication change or noncompliance Occupational concerns  Patient Strengths: Ability for insight Average or above average intelligence Capable of independent living Communication skills General fund of knowledge Motivation for treatment/growth  Treatment Modalities: Medication Management, Group therapy, Case management,  1 to 1 session with clinician, Psychoeducation, Recreational therapy.   Physician Treatment Plan for Primary Diagnosis: Bipolar 1 disorder, depressed, severe (HCC) Long Term Goal(s): Improvement in symptoms so as ready for  discharge Improvement in symptoms so as ready for discharge   Short Term Goals: Ability to verbalize feelings will improve Ability to disclose and discuss suicidal ideas Ability to demonstrate self-control will improve Ability to identify and develop effective coping behaviors will improve Compliance with prescribed medications will improve  Medication Management: Evaluate patient's response, side effects, and tolerance of medication regimen.  Therapeutic Interventions: 1 to 1 sessions, Unit Group sessions and Medication administration.  Evaluation of Outcomes: Progressing  Physician Treatment Plan for Secondary Diagnosis: Principal Problem:   Bipolar 1 disorder, depressed, severe (HCC)  Long Term Goal(s): Improvement in symptoms so as ready for discharge Improvement in symptoms so as ready for discharge   Short Term Goals: Ability to verbalize feelings will improve Ability to disclose and discuss suicidal ideas Ability to demonstrate self-control will improve Ability to identify and develop effective coping behaviors will improve Compliance with prescribed medications will improve     Medication Management: Evaluate patient's response, side effects, and tolerance of medication regimen.  Therapeutic Interventions: 1 to 1 sessions, Unit Group sessions and Medication administration.  Evaluation of Outcomes: Progressing   RN Treatment Plan for Primary Diagnosis: Bipolar 1 disorder, depressed, severe (HCC) Long Term Goal(s): Knowledge of disease and therapeutic regimen to maintain health will improve  Short Term Goals: Ability to identify and develop effective coping behaviors will improve and Compliance with prescribed medications will improve  Medication Management: RN will administer medications as ordered by provider, will assess and evaluate patient's response and provide education to patient for prescribed medication.  RN will report any adverse and/or side effects to  prescribing provider.  Therapeutic Interventions: 1 on 1 counseling sessions, Psychoeducation, Medication administration, Evaluate responses to treatment, Monitor vital signs and CBGs as ordered, Perform/monitor CIWA, COWS, AIMS and Fall Risk screenings as ordered, Perform wound care treatments as ordered.  Evaluation of Outcomes: Progressing   LCSW Treatment Plan for Primary Diagnosis: Bipolar 1 disorder, depressed, severe (HCC) Long Term Goal(s): Safe transition to appropriate next level of care at discharge, Engage patient in therapeutic group addressing interpersonal concerns.  Short Term Goals: Engage patient in aftercare planning with referrals and resources, Increase social support and Increase skills for wellness and recovery  Therapeutic Interventions: Assess for all discharge needs, 1 to 1 time with Social worker, Explore available resources and support systems, Assess for adequacy in community support network, Educate family and significant other(s) on suicide prevention, Complete Psychosocial Assessment, Interpersonal group therapy.  Evaluation of Outcomes: Progressing   Progress in Treatment: Attending groups: Yes. Participating in groups: Yes. Taking medication as prescribed: Yes. Toleration medication: Yes. Family/Significant other contact made: No, will contact:  pt declined Patient understands diagnosis: Yes. Discussing patient identified problems/goals with staff: Yes. Medical problems stabilized or resolved: Yes. Denies suicidal/homicidal ideation: Yes. Issues/concerns per patient self-inventory: No. Other: none  New problem(s) identified: No, Describe:  none  New Short Term/Long Term Goal(s):Pt goal: to develop a plan to move forward.  Discharge Plan or Barriers:   Reason for Continuation of Hospitalization: Depression Medication stabilization  Estimated Length of Stay:2-4 days.  Attendees: Patient:Ronald Fritz 08/01/2017   Physician: Dr Jama Flavorsobos, MD 08/01/2017    Nursing: Waynetta SandyJan Wright, RN 08/01/2017   RN Care Manager: 08/01/2017   Social Worker: Daleen SquibbGreg Meryem Haertel, LCSW 08/01/2017   Recreational Therapist:  08/01/2017   Other:  08/01/2017   Other:  08/01/2017   Other: 08/01/2017     Scribe for Treatment Team: Lorri FrederickWierda, Bora Bost Jon, LCSW 08/01/2017 2:17 PM

## 2017-08-01 NOTE — Progress Notes (Signed)
Recreation Therapy Notes  Date: 08/01/17 Time: 0930 Location: 300 Hall Dayroom  Group Topic: Stress Management  Goal Area(s) Addresses:  Patient will verbalize importance of using healthy stress management.  Patient will identify positive emotions associated with healthy stress management.   Behavioral Response: Engaged  Intervention: Stress Management  Activity :  Mountain Meditation.  LRT played a meditation that explained the concept of taking on the characteristics of mountains (resilience, steady, immoveable) in daily life.  Patients were to follow along as the meditation was read to engage in the practice.  Education:  Stress Management, Discharge Planning.   Education Outcome: Acknowledges edcuation/In group clarification offered/Needs additional education  Clinical Observations/Feedback: Pt attended group.    Alaney Witter, LRT/CTRS         Hudsen Fei A 08/01/2017 10:41 AM 

## 2017-08-01 NOTE — Progress Notes (Signed)
Adult Psychoeducational Group Note  Date:  08/01/2017 Time:  1000 Group Topic/Focus:  Identifying Needs:   The focus of this group is to help patients identify their personal needs that have been historically problematic and identify healthy behaviors to address their needs.  Participation Level:  Active  Participation Quality:  Appropriate and Attentive  Affect:  Appropriate  Cognitive:  Appropriate  Insight: Appropriate and Good  Engagement in Group:  Engaged  Modes of Intervention:  Discussion, Education, Rapport Building and Support  Additional Comments:  Pt was attentive and participated in group discussion about vulnerability   Gwenevere Ghazili, Viraaj Vorndran Patience 08/01/2017, 1:10 PM

## 2017-08-01 NOTE — Progress Notes (Signed)
[facility] MD Progress Note  08/01/2017 12:19 PM Ronald Fritz  MRN:  [id number] Subjective:  Patient reports partial improvement and states that although still depressed he does feel he is improving and describes feeling less hopeless, states " my outlook on things is better ". Thus far denies medication side effects. Objective:  I have discussed case with treatment team and have met with patient . Presents with improving mood and today presents less blunted, less constricted, smiles briefly at times, and acknowledges feeling better. At present denies medication side effects. Denies suicidal ideations. Visible in milieu, day room, some group participation. Principal Problem: Bipolar 1 disorder, depressed, severe ([place]) Diagnosis:   Patient Active Problem List   Diagnosis Date Noted  . Bipolar 1 disorder, depressed, severe ([place]) [F31.4] 07/30/2017  . 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 Bipolar disord, crnt episode mixed, severe, w psych features ([place]) [F31.64] 11/12/2016  . Acute cholecystitis [K81.0] 10/03/2015   Total Time spent with patient: 15 minutes  Past Psychiatric History:  Past Medical History: History reviewed. No pertinent past medical history.  Past Surgical History:  Procedure Laterality Date  . CHOLECYSTECTOMY N/A 10/04/2015   Procedure: LAPAROSCOPIC CHOLECYSTECTOMY;  Surgeon: Ralene Ok, MD;  Location: [place];  Service: General;  Laterality: N/A;   Family History: History reviewed. No pertinent family history. Family Psychiatric  History:  Social History:  Social History   Substance and Sexual Activity  Alcohol Use No     Social History   Substance and Sexual Activity  Drug Use No    Social History   Socioeconomic History  . Marital status: Married    Spouse name: None  . Number of children: None  . Years of education: None  . Highest education level: None  Social Needs  . Financial resource strain: None  . Food insecurity - worry: None   . Food insecurity - inability: None  . Transportation needs - medical: None  . Transportation needs - non-medical: None  Occupational History  . None  Tobacco Use  . Smoking status: Never Smoker  . Smokeless tobacco: Never Used  Substance and Sexual Activity  . Alcohol use: No  . Drug use: No  . Sexual activity: Yes    Birth control/protection: None  Other Topics Concern  . None  Social History Narrative  . None   Additional Social History:    Pain Medications: please see mar Prescriptions: please see mar Over the Counter: please see mar History of alcohol / drug use?: No history of alcohol / drug abuse Longest period of sobriety (when/how long): NA  Sleep: improving   Appetite:  improving   Current Medications: Current Facility-Administered Medications  Medication Dose Route Frequency Provider Last Rate Last Dose  . acetaminophen (TYLENOL) tablet 650 mg  650 mg Oral Q6H PRN Rankin, Shuvon B, NP      . alum & mag hydroxide-simeth (MAALOX/MYLANTA) 200-200-20 MG/5ML suspension 30 mL  30 mL Oral Q4H PRN Rankin, Shuvon B, NP      . lamoTRIgine (LAMICTAL) tablet 25 mg  25 mg Oral Daily Unice Vantassel, Myer Peer, MD   25 mg at 08/01/17 0834  . LORazepam (ATIVAN) tablet 0.5 mg  0.5 mg Oral Q6H PRN Chalise Pe A, MD      . lurasidone (LATUDA) tablet 20 mg  20 mg Oral Q breakfast Ashliegh Parekh, Myer Peer, MD   20 mg at 08/01/17 0834  . magnesium hydroxide (MILK OF MAGNESIA) suspension 30 mL  30 mL Oral Daily PRN  Rankin, Shuvon B, NP      . traZODone (DESYREL) tablet 50 mg  50 mg Oral QHS,MR X 1 Laverle Hobby, PA-C   50 mg at 07/30/17 2243    Lab Results:  Results for orders placed or performed during the hospital encounter of 07/30/17 (from the past 48 hour(s))  Urinalysis, Complete w Microscopic     Status: Abnormal   Collection Time: 07/30/17  9:47 PM  Result Value Ref Range   Color, Urine YELLOW (A) YELLOW   APPearance TURBID (A) CLEAR   Specific Gravity, Urine 1.029 1.005 -  1.030   pH 5.0 5.0 - 8.0   Glucose, UA NEGATIVE NEGATIVE mg/dL   Hgb urine dipstick NEGATIVE NEGATIVE   Bilirubin Urine NEGATIVE NEGATIVE   Ketones, ur NEGATIVE NEGATIVE mg/dL   Protein, ur NEGATIVE NEGATIVE mg/dL   Nitrite NEGATIVE NEGATIVE   Leukocytes, UA NEGATIVE NEGATIVE   RBC / HPF NONE SEEN 0 - 5 RBC/hpf   WBC, UA NONE SEEN 0 - 5 WBC/hpf   Bacteria, UA FEW (A) NONE SEEN   Squamous Epithelial / LPF NONE SEEN NONE SEEN    Comment: Performed at [facility], [place] [address] [place], [place] [address]  Urine rapid drug screen (hosp performed)not at [facility]     Status: Abnormal   Collection Time: 07/30/17  9:47 PM  Result Value Ref Range   Opiates NONE DETECTED NONE DETECTED   Cocaine NONE DETECTED NONE DETECTED   Benzodiazepines NONE DETECTED NONE DETECTED   Amphetamines NONE DETECTED NONE DETECTED   Tetrahydrocannabinol POSITIVE (A) NONE DETECTED   Barbiturates NONE DETECTED NONE DETECTED    Comment: (NOTE) DRUG SCREEN FOR MEDICAL PURPOSES ONLY.  IF CONFIRMATION IS NEEDED FOR ANY PURPOSE, NOTIFY LAB WITHIN 5 DAYS. LOWEST DETECTABLE LIMITS FOR URINE DRUG SCREEN Drug Class                     Cutoff (ng/mL) Amphetamine and metabolites    1000 Barbiturate and metabolites    200 Benzodiazepine                 245 Tricyclics and metabolites     300 Opiates and metabolites        300 Cocaine and metabolites        300 THC                            50 Performed at [facility], [place] [address], [place], [place] [address]   CBC     Status: None   Collection Time: 07/31/17  6:55 AM  Result Value Ref Range   WBC 6.8 4.0 - 10.5 K/uL   RBC 5.57 4.22 - 5.81 MIL/uL   Hemoglobin 16.2 13.0 - 17.0 g/dL   HCT 46.7 39.0 - 52.0 %   MCV 83.8 78.0 - 100.0 fL   MCH 29.1 26.0 - 34.0 pg   MCHC 34.7 30.0 - 36.0 g/dL   RDW 13.3 11.5 - 15.5 %   Platelets 225 150 - 400 K/uL    Comment: Performed at [facility], [place] [address]  [place],  [address]  Comprehensive metabolic panel     Status: Abnormal   Collection Time: 07/31/17  6:55 AM  Result Value Ref Range   Sodium 139 135 - 145 mmol/L   Potassium 4.1 3.5 - 5.1 mmol/L   Chloride 104 101 - 111 mmol/L  CO2 25 22 - 32 mmol/L   Glucose, Bld 110 (H) 65 - 99 mg/dL   BUN 12 6 - 20 mg/dL   Creatinine, Ser 1.03 0.61 - 1.24 mg/dL   Calcium 9.5 8.9 - 10.3 mg/dL   Total Protein 8.0 6.5 - 8.1 g/dL   Albumin 4.4 3.5 - 5.0 g/dL   AST 31 15 - 41 U/L   ALT 46 17 - 63 U/L   Alkaline Phosphatase 59 38 - 126 U/L   Total Bilirubin 0.8 0.3 - 1.2 mg/dL   GFR calc non Af Amer >60 >60 mL/min   GFR calc Af Amer >60 >60 mL/min    Comment: (NOTE) The eGFR has been calculated using the CKD EPI equation. This calculation has not been validated in all clinical situations. eGFR's persistently <60 mL/min signify possible Chronic Kidney Disease.    Anion gap 10 5 - 15    Comment: Performed at [facility], [place] [address], [place], [place] [address]  Hemoglobin A1c     Status: None   Collection Time: 07/31/17  6:55 AM  Result Value Ref Range   Hgb A1c MFr Bld 5.0 4.8 - 5.6 %    Comment: (NOTE) Pre diabetes:          5.7%-6.4% Diabetes:              >6.4% Glycemic control for   <7.0% adults with diabetes    Mean Plasma Glucose 96.8 mg/dL    Comment: Performed at [place] [address], [place], [place] [address]  Ethanol     Status: None   Collection Time: 07/31/17  6:55 AM  Result Value Ref Range   Alcohol, Ethyl (B) <10 <10 mg/dL    Comment:        LOWEST DETECTABLE LIMIT FOR SERUM ALCOHOL IS 10 mg/dL FOR MEDICAL PURPOSES ONLY Performed at [facility] [address], [place], [place] [address]   Lipid panel     Status: Abnormal   Collection Time: 07/31/17  6:55 AM  Result Value Ref Range   Cholesterol 149 0 - 200 mg/dL   Triglycerides 116 <150 mg/dL   HDL 36 (L) >40 mg/dL   Total CHOL/HDL Ratio 4.1 RATIO   VLDL  23 0 - 40 mg/dL   LDL Cholesterol 90 0 - 99 mg/dL    Comment:        Total Cholesterol/HDL:CHD Risk Coronary Heart Disease Risk Table                     Men   Women  1/2 Average Risk   3.4   3.3  Average Risk       5.0   4.4  2 X Average Risk   9.6   7.1  3 X Average Risk  23.4   11.0        Use the calculated Patient Ratio above and the CHD Risk Table to determine the patient's CHD Risk.        ATP III CLASSIFICATION (LDL):  <100     mg/dL   Optimal  100-129  mg/dL   Near or Above                    Optimal  130-159  mg/dL   Borderline  160-189  mg/dL   High  >190     mg/dL   Very High Performed at [facility]  [place], [place] [address]   TSH     Status: None   Collection Time: 07/31/17  6:55 AM  Result Value Ref Range   TSH 1.092 0.350 - 4.500 uIU/mL    Comment: Performed by a 3rd Generation assay with a functional sensitivity of <=0.01 uIU/mL. Performed at [facility], [place] [address], [place], [place] [address]     Blood Alcohol level:  Lab Results  Component Value Date   ETH <10 07/31/2017   ETH <5 56/25/6389    Metabolic Disorder Labs: Lab Results  Component Value Date   HGBA1C 5.0 07/31/2017   MPG 96.8 07/31/2017   MPG 103 11/19/2016   Lab Results  Component Value Date   PROLACTIN 60.4 (H) 11/19/2016   Lab Results  Component Value Date   CHOL 149 07/31/2017   TRIG 116 07/31/2017   HDL 36 (L) 07/31/2017   CHOLHDL 4.1 07/31/2017   VLDL 23 07/31/2017   LDLCALC 90 07/31/2017   LDLCALC 52 11/19/2016    Physical Findings: AIMS: Facial and Oral Movements Muscles of Facial Expression: None, normal Lips and Perioral Area: None, normal Jaw: None, normal Tongue: None, normal,Extremity Movements Upper (arms, wrists, hands, fingers): None, normal Lower (legs, knees, ankles, toes): None, normal, Trunk Movements Neck, shoulders, hips: None, normal, Overall Severity Severity of abnormal movements  (highest score from questions above): None, normal Incapacitation due to abnormal movements: None, normal Patient's awareness of abnormal movements (rate only patient's report): No Awareness, Dental Status Current problems with teeth and/or dentures?: No Does patient usually wear dentures?: No  CIWA:    COWS:     Musculoskeletal: Strength & Muscle Tone: within normal limits Gait & Station: normal Patient leans: N/A  Psychiatric Specialty Exam: Physical Exam  ROS denies headache, no chest pain, no shortness of breath, no vomiting .   Blood pressure 119/73, pulse 69, temperature 98 F (36.7 C), temperature source Oral, resp. rate 18, height '6\' 3"'$  (1.905 m), weight (!) 139.7 kg (308 lb), SpO2 98 %.Body mass index is 38.5 kg/m.  General Appearance: improving grooming   Eye Contact:  fair but improving , better eye contact today  Speech:  Normal Rate  Volume:  Normal  Mood:  reports some improvement, less depressed   Affect:  less constricted, smiles briefly ( appropriately ) at times during session  Thought Process:  Linear and Descriptions of Associations: Intact  Orientation:  Full (Time, Place, and Person)  Thought Content:  no hallucinations, no delusions, not internally preoccupied   Suicidal Thoughts:  No at this time denies suicidal or self injurious ideations, denies homicidal ideations, contracts for safety on unit  Homicidal Thoughts:  No  Memory:  recent and remote grossly intact   Judgement:  Fair- improving   Insight:  Fair improving   Psychomotor Activity:  Normal  Concentration:  Concentration: Good and Attention Span: Good  Recall:  Good  Fund of Knowledge:  Good  Language:  Good  Akathisia:  Negative  Handed:  Right  AIMS (if indicated):     Assets:  Communication Skills Desire for Improvement Resilience  ADL's:  Intact  Cognition:  WNL  Sleep:  Number of Hours: 5.75   Assessment - patient reports partially improved mood and states he feels less depressed  than on admission. He remains vaguely constricted, but affect is more reactive. Denies SI at this time. Thus far tolerating medications well ( Lamictal and Latuda)   Treatment Plan Summary: Daily contact with patient to assess  and evaluate symptoms and progress in treatment, Medication management, Plan inpatient treatment  and medications as below Encourage group and milieu participation to work on coping skills and symptom reduction Continue Lamictal 25 mgrs QDAY for mood disorder , depression Continue Latuda 20 mgrs QDAY  for mood disorder  Continue Trazodone 50 mgrs QHS PRN for insomnia as needed  Continue Ativan 0.5 mgrs Q 6 hours PRN for anxiety as needed  Treatment team working on disposition planning options Jenne Campus, MD 08/01/2017, 12:19 PM

## 2017-08-01 NOTE — [facility] Group Notes (Signed)
  [facility]  Date/Time: 08/01/17, 1315  Type of Therapy/Topic:  Group Therapy:  Emotion Regulation  Participation Level:  Active   Mood:pleasant  Description of Group:    The purpose of this group is to assist patients in learning to regulate negative emotions and experience positive emotions. Patients will be guided to discuss ways in which they have been vulnerable to their negative emotions. These vulnerabilities will be juxtaposed with experiences of positive emotions or situations, and patients challenged to use positive emotions to combat negative ones. Special emphasis will be placed on coping with negative emotions in conflict situations, and patients will process healthy conflict resolution skills.  Therapeutic Goals: 1. Patient will identify two positive emotions or experiences to reflect on in order to balance out negative emotions:  2. Patient will label two or more emotions that they find the most difficult to experience:  3. Patient will be able to demonstrate positive conflict resolution skills through discussion or role plays:   Summary of Patient Progress: Pt shared that fear and sadness are emotions that are difficult for him to experience.  Pt participated in group discussion about positive ways to deal with negative emotions.       Therapeutic Modalities:   Cognitive Behavioral Therapy Feelings Identification Dialectical Behavioral Therapy  Daleen SquibbGreg Lorriann Hansmann, LCSW

## 2017-08-01 NOTE — Progress Notes (Signed)
Pt attend wrap up group. His day was a 2. His goal was to cooperate with the care givers and he accomplish his goal.

## 2017-08-02 DIAGNOSIS — G4700 Insomnia, unspecified: Secondary | ICD-10-CM

## 2017-08-02 NOTE — [facility] Group Notes (Signed)
LCSW Group Therapy Note  08/02/2017   9:30-10:30am (300 hall)                10:30-11:30am (400 hall)                11:30am-12:00pm (500 hall)  Type of Therapy and Topic:  Group Therapy: Anger Cues and Responses  Participation Level:  Active   Description of Group:   In this group, patients learned how to recognize the physical, cognitive, emotional, and behavioral responses they have to anger-provoking situations.  They identified a recent time they became angry and how they reacted.  They analyzed how their reaction was possibly beneficial and how it was possibly unhelpful.  The group discussed a variety of healthier coping skills that could help with such a situation in the future.  Deep breathing was practiced briefly.  Therapeutic Goals: 1. Patients will remember their last incident of anger and how they felt emotionally and physically, what their thoughts were at the time, and how they behaved. 2. Patients will identify how their behavior at that time worked for them, as well as how it worked against them. 3. Patients will explore possible new behaviors to use in future anger situations. 4. Patients will learn that anger itself is normal and cannot be eliminated, and that healthier reactions can assist with resolving conflict rather than worsening situations.  Summary of Patient Progress:  The patient shared that his most recent time of anger was something he cannot recall and said he does not get personally angry, only angry at social injustices.  He showed little insight and kept asking theoretical questions or making vague philosophical statements.    Therapeutic Modalities:   Cognitive Behavioral Therapy  Lynnell ChadMareida J Grossman-Orr  08/02/2017 12:00pm

## 2017-08-02 NOTE — Progress Notes (Addendum)
[facility]  08/02/2017 10:57 AM Ronald BushDavid Fritz  MRN:  [id number] Subjective:  Patient states that he he is doing good today. He reports that he feels he is ready to go home, but is concerned about dealing with his wife when he gets home. He plans to get some cloths and then stay in a hotel. Then next week he will schedule an appointment with a lawyer. He feels he needs to get some distance from his wife as she is the problem with the relationship. He denies any SI/HI/AVH and contracts for safety.  . Objective:  Patient's chart and findings reviewed and discussed with the treatment team. Patient presents in the day room and has been interacting with peers and staff appropriately. He has been attending group and participating. He has been pleasant and cooperative. Will continue current medications.      Principal Problem: Bipolar 1 disorder, depressed, severe (HCC) Diagnosis:   Patient Active Problem List   Diagnosis Date Noted  . Bipolar 1 disorder, depressed, severe (HCC) [F31.4] 07/30/2017  . 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 Bipolar disord, crnt episode mixed, severe, w psych features (HCC) [F31.64] 11/12/2016  . Acute cholecystitis [K81.0] 10/03/2015   Total Time spent with patient: 15 minutes  Past Psychiatric History:  Past Medical History: History reviewed. No pertinent past medical history.  Past Surgical History:  Procedure Laterality Date  . CHOLECYSTECTOMY N/A 10/04/2015   Procedure: LAPAROSCOPIC CHOLECYSTECTOMY;  Surgeon: Axel FillerArmando Ramirez, MD;  Location: [facility] OR;  Service: General;  Laterality: N/A;   Family History: History reviewed. No pertinent family history. Family Psychiatric  History:  Social History:  Social History   Substance and Sexual Activity  Alcohol Use No     Social History   Substance and Sexual Activity  Drug Use No    Social History   Socioeconomic History  . Marital status: Married    Spouse name: None  . Number of  children: None  . Years of education: None  . Highest education level: None  Social Needs  . Financial resource strain: None  . Food insecurity - worry: None  . Food insecurity - inability: None  . Transportation needs - medical: None  . Transportation needs - non-medical: None  Occupational History  . None  Tobacco Use  . Smoking status: Never Smoker  . Smokeless tobacco: Never Used  Substance and Sexual Activity  . Alcohol use: No  . Drug use: No  . Sexual activity: Yes    Birth control/protection: None  Other Topics Concern  . None  Social History Narrative  . None   Additional Social History:    Pain Medications: please see mar Prescriptions: please see mar Over the Counter: please see mar History of alcohol / drug use?: No history of alcohol / drug abuse Longest period of sobriety (when/how long): NA  Sleep: Good  Appetite:  Good  Current Medications: Current Facility-Administered Medications  Medication Dose Route Frequency Provider Last Rate Last Dose  . acetaminophen (TYLENOL) tablet 650 mg  650 mg Oral Q6H PRN Rankin, Shuvon B, NP      . alum & mag hydroxide-simeth (MAALOX/MYLANTA) 200-200-20 MG/5ML suspension 30 mL  30 mL Oral Q4H PRN Rankin, Shuvon B, NP      . lamoTRIgine (LAMICTAL) tablet 25 mg  25 mg Oral Daily Cobos, Rockey SituFernando A, MD   25 mg at 08/02/17 0817  . LORazepam (ATIVAN) tablet 0.5 mg  0.5 mg Oral Q6H PRN Cobos, Rockey SituFernando A,  MD      . lurasidone (LATUDA) tablet 20 mg  20 mg Oral Q breakfast Cobos, Rockey Situ, MD   20 mg at 08/02/17 0817  . magnesium hydroxide (MILK OF MAGNESIA) suspension 30 mL  30 mL Oral Daily PRN Rankin, Shuvon B, NP      . traZODone (DESYREL) tablet 50 mg  50 mg Oral QHS,MR X 1 Kerry Hough, PA-C   50 mg at 08/01/17 2114    Lab Results:  No results found for this or any previous visit (from the past 48 hour(s)).  Blood Alcohol level:  Lab Results  Component Value Date   ETH <10 07/31/2017   ETH <5 11/14/2016     Metabolic Disorder Labs: Lab Results  Component Value Date   HGBA1C 5.0 07/31/2017   MPG 96.8 07/31/2017   MPG 103 11/19/2016   Lab Results  Component Value Date   PROLACTIN 60.4 (H) 11/19/2016   Lab Results  Component Value Date   CHOL 149 07/31/2017   TRIG 116 07/31/2017   HDL 36 (L) 07/31/2017   CHOLHDL 4.1 07/31/2017   VLDL 23 07/31/2017   LDLCALC 90 07/31/2017   LDLCALC 52 11/19/2016    Physical Findings: AIMS: Facial and Oral Movements Muscles of Facial Expression: (P) None, normal Lips and Perioral Area: (P) None, normal Jaw: (P) None, normal Tongue: (P) None, normal,Extremity Movements Upper (arms, wrists, hands, fingers): (P) None, normal Lower (legs, knees, ankles, toes): (P) None, normal, Trunk Movements Neck, shoulders, hips: (P) None, normal, Overall Severity Severity of abnormal movements (highest score from questions above): (P) None, normal Incapacitation due to abnormal movements: (P) None, normal Patient's awareness of abnormal movements (rate only patient's report): (P) No Awareness, Dental Status Current problems with teeth and/or dentures?: (P) No Does patient usually wear dentures?: (P) No  CIWA:    COWS:     Musculoskeletal: Strength & Muscle Tone: within normal limits Gait & Station: normal Patient leans: N/A  Psychiatric Specialty Exam: Physical Exam  Nursing note and vitals reviewed. Constitutional: He is oriented to person, place, and time. He appears well-developed and well-nourished.  Cardiovascular: Normal rate.  Respiratory: Effort normal.  Musculoskeletal: Normal range of motion.  Neurological: He is alert and oriented to person, place, and time.  Skin: Skin is warm.    Review of Systems  Constitutional: Negative.   HENT: Negative.   Eyes: Negative.   Respiratory: Negative.   Cardiovascular: Negative.   Gastrointestinal: Negative.   Genitourinary: Negative.   Musculoskeletal: Negative.   Skin: Negative.    Neurological: Negative.   Endo/Heme/Allergies: Negative.   Psychiatric/Behavioral: Positive for depression. Negative for hallucinations and suicidal ideas. The patient is nervous/anxious (about going home).    denies headache, no chest pain, no shortness of breath, no vomiting .   Blood pressure 118/66, pulse 70, temperature 97.9 F (36.6 C), temperature source Oral, resp. rate 18, height 6\' 3"  (1.905 m), weight (!) 139.7 kg (308 lb), SpO2 98 %.Body mass index is 38.5 kg/m.  General Appearance: Casual and Fairly Groomed  Eye Contact:  fair but improving , better eye contact today  Speech:  Clear and Coherent and Normal Rate  Volume:  Normal  Mood:  Depressed and but improving  Affect:  Flat  Thought Process:  Goal Directed and Descriptions of Associations: Intact  Orientation:  Full (Time, Place, and Person)  Thought Content:  WDL  Suicidal Thoughts:  No   Homicidal Thoughts:  No  Memory:  recent and remote  grossly intact   Judgement:  Fair- improving   Insight:  Fair improving   Psychomotor Activity:  Normal  Concentration:  Concentration: Good and Attention Span: Good  Recall:  Good  Fund of Knowledge:  Good  Language:  Good  Akathisia:  Negative  Handed:  Right  AIMS (if indicated):     Assets:  Communication Skills Desire for Improvement Financial Resources/Insurance Housing Resilience Transportation  ADL's:  Intact  Cognition:  WNL  Sleep:  Number of Hours: 5.75   Problems Addressed:  Bipolar I  Treatment Plan Summary: Daily contact with patient to assess and evaluate symptoms and progress in treatment, Medication management, Plan inpatient treatment  and medications as below -Continue Lamictal 25 mg Daily for mood stability -Continue Latuda 20 mg Daily  for mood stability  -Continue Trazodone 50 mg QHS PRN for insomnia   -Continue Ativan 0.5 mg Q^H PRN for anxiety   -Encourage group therapy participation Maryfrances Bunnell, FNP 08/02/2017, 10:57 AM   Agrees with  NP Progress Note

## 2017-08-02 NOTE — Progress Notes (Signed)
Nursing Progress Note 1900-0730  D) Patient presents with flat and depressed mood. Patient did attend group and was seen visible in the milieu but does not initiate interaction with staff or peers. Patient is minimal during interaction with staff. Patient denies SI/HI/AVH or pain. Patient contracts for safety on the unit. Patient requests medications for sleep this evening.  A) Patient educated about and provided medication as scheduled or requested per provider's orders. Patient safety maintained with q15 min safety checks. Low fall risk precautions in place. Emotional support given. 1:1 interaction and active listening provided. Snacks and fluids provided. Labs, vital signs and patient behavior monitored throughout shift. Patient encouraged to work on treatment plan.  R) Patient remains safe on the unit at this time. Patient agrees to make needs known to staff. Will continue to monitor and assess for changes.

## 2017-08-02 NOTE — Progress Notes (Signed)
Patient ID: Ronald BushDavid Fritz, male   DOB: 1980/09/22, 37 y.o.   MRN: [id number]  Pt currently presents with a flat affect and depressed behavior. Pt reports to Clinical research associatewriter that their goal is to "go home soon." Pt states "My plan is to go to an extended stay and get out of the toxic situation I'm currently in." Reports ongoing anxiety about discharge but reports that he plans to reach out to his mother and friends for support. Pt reports good sleep with current medication regimen.   Pt provided with medications per providers orders. Pt's labs and vitals were monitored throughout the night. Pt given a 1:1 about emotional and mental status. Pt supported and encouraged to express concerns and questions. Pt educated on medications and assertiveness techniques.   Pt's safety ensured with 15 minute and environmental checks. Pt currently denies SI/HI and A/V hallucinations. Pt verbally agrees to seek staff if SI/HI or A/VH occurs and to consult with staff before acting on any harmful thoughts. Will continue POC.

## 2017-08-02 NOTE — Plan of Care (Signed)
Patient has remained safe and free from injury on the unit. Denies desire to harm self or others when asked. Patient maintains that he can contract for safety. Will continue to monitor.

## 2017-08-02 NOTE — Progress Notes (Signed)
D: Patient alert and oriented. Affect/mood: Pleasant, cooperative. Flat and forwards little to this writer at times. Denies SI, HI, AVH at this time. Denies pain. Patient was unable to identify a goal for today. Denied any physical complaints. Reports "fair" sleep with the use of sleep medication, and "fair" appetite. Patient endorses "low" energy and "good" concentration. Patient rates depression "6", hopelessness "2", and anxiety "2" (0-10).  A: Scheduled medications administered to patient per MD order. Support and encouragement provided. Routine safety checks conducted every 15 minutes. Patient informed to notify staff with problems or concerns. Encouraged to notify staff if feelings of harm toward self or others arise. Patient agrees, and maintains that he can remains safe on the unit.  R: No adverse drug reactions noted. Patient contracts for safety at this time. Patient compliant with medications and treatment plan. Patient receptive, calm, and cooperative. Patient interacts well with others on the unit. Patient remains safe at this time. Will continue to monitor.

## 2017-08-02 NOTE — Plan of Care (Signed)
Nursing Care Plan: Safety: Goal: Periods of time without injury will increase Outcome: Patient is on q15 minute safety checks and contracts for safety on the unit. 08/02/2017 0111 - Progressing by Ferrel Loganollazo, Thersa Mohiuddin A, RN

## 2017-08-03 MED ORDER — TRAZODONE HCL 50 MG PO TABS: 50.0000 mg | ORAL_TABLET | Freq: Every evening | ORAL | 0 refills | Status: DC | PRN

## 2017-08-03 MED ORDER — LAMOTRIGINE 25 MG PO TABS: 25.0000 mg | ORAL_TABLET | Freq: Every day | ORAL | 0 refills | Status: DC

## 2017-08-03 MED ORDER — LURASIDONE HCL 20 MG PO TABS: 20.0000 mg | ORAL_TABLET | Freq: Every day | ORAL | 0 refills | Status: DC

## 2017-08-03 NOTE — Progress Notes (Addendum)
D Pt completed his daily assessment and on this he wrote  He denied SI today and he rated his depression, hopelessness and anxeity " 6/2/2/",respectively. His dc intructions are  Reviewed with him and cc given to him by Clinical research associatewriter ( AVS, SRA, SSP and transition record) along with prescriptions for continuing meds and belongings from his locker. He is escorted to bldg entrance and dc'd.

## 2017-08-03 NOTE — [facility] Group Notes (Signed)
Orientation / Goals   Date:  08/03/2017  Time:  9:57 AM  Type of Therapy:  Nurse Education  /  The group focuses on teaching patients who their staff is, what the staff's responsibilities are and what the unit programming looks like.   Participation Level:  Active  Participation Quality:  Attentive  Affect:  Appropriate  Cognitive:  Alert  Insight:  Appropriate  Engagement in Group:  Engaged  Modes of Intervention:  Education  Summary of Progress/Problems:  Ronald Fritz, [name] Lynn 08/03/2017, 9:57 AM

## 2017-08-03 NOTE — [facility] Group Notes (Signed)
[facility] LCSW Group Therapy Note  Date/Time:  08/03/2017  10:00 AM-11:00AM  Type of Therapy and Topic:  Group Therapy:  Music and Mood  Participation Level:  Active   Description of Group: In this process group, members listened to a variety of genres of music and identified that different types of music evoke different responses.  Patients were encouraged to identify music that was soothing for them and music that was energizing for them.  Patients discussed how this knowledge can help with wellness and recovery in various ways including managing depression and anxiety as well as encouraging healthy sleep habits.    Therapeutic Goals: 1. Patients will explore the impact of different varieties of music on mood 2. Patients will verbalize the thoughts they have when listening to different types of music 3. Patients will identify music that is soothing to them as well as music that is energizing to them 4. Patients will discuss how to use this knowledge to assist in maintaining wellness and recovery 5. Patients will explore the use of music as a coping skill  Summary of Patient Progress:  At the beginning of group, patient expressed that he felt better than he did yesterday, and at the end of group during which he participated heavily, stated he felt better than at the beginning of group.  He gave good analogies and was appropriate and engaged, even using humor, during group.  Therapeutic Modalities: Solution Focused Brief Therapy Activity   Ambrose MantleMareida Grossman-Orr, LCSW

## 2017-08-03 NOTE — Progress Notes (Signed)
  [facility] :  Will you be returning to the same living situation after discharge:  No.  Intends to go to an extended stay hotel At discharge, do you have transportation home?: Yes,  no barriers presented by patient Do you have the ability to pay for your medications: Yes,  no barriers presented by patient  Release of information consent forms completed and turned in to Medical Records by CSW.  Patient to Follow up at: Follow-up Information    [facility].   Why:  Upcoming therapy appointment is this coming week with Ree KidaKelly Caliglia.  Please call to confirm the day and time.  Medication management appointment will need to be set while you are at the office for your therapy appointment. Contact information: [address] [place] [id number] [phone]           Next level of care provider [facility]:no  Safety Planning and Suicide Prevention discussed: No.  Patient declined, so it was reviewed with him  Have you used any form of tobacco in the last 30 days? (Cigarettes, Smokeless Tobacco, Cigars, and/or Pipes): No  Has patient been referred to the Quitline?: N/A patient is not a smoker  Patient has been referred for addiction treatment: N/A  Lynnell ChadMareida J Grossman-Orr, LCSW 08/03/2017, 8:27 AM

## 2017-08-03 NOTE — [facility] Suicide Risk Assessment (Addendum)
[facility]   Principal Problem: Bipolar 1 disorder, depressed, severe (HCC) Discharge Diagnoses:  Patient Active Problem List   Diagnosis Date Noted  . Bipolar 1 disorder, depressed, severe (HCC) [F31.4] 07/30/2017  . 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 Bipolar disord, crnt episode mixed, severe, w psych features (HCC) [F31.64] 11/12/2016  . Acute cholecystitis [K81.0] 10/03/2015    Total Time spent with patient: 30 minutes  Musculoskeletal: Strength & Muscle Tone: within normal limits Gait & Station: normal Patient leans: N/A  Psychiatric Specialty Exam: ROS  No headache, no chest pain, no shortness of breath,no nausea, no vomiting  Blood pressure 128/80, pulse 72, temperature 97.8 F (36.6 C), temperature source Oral, resp. rate 18, height 6\' 3"  (1.905 m), weight (!) 139.7 kg (308 lb), SpO2 98 %.Body mass index is 38.5 kg/m.  General Appearance: Well Groomed  Eye Contact::  Good  Speech:  Normal Rate409  Volume:  Normal  Mood:  states his mood has improved compared to admission and describes mood is 6/10  Affect:  less constricted, smiles at times appropriately   Thought Process:  Linear and Descriptions of Associations: Intact  Orientation:  Full (Time, Place, and Person)  Thought Content:  no hallucinations, no delusions, not internally preoccupied   Suicidal Thoughts:  No denies any suicidal or self injurious ideations  Homicidal Thoughts:  No denies homicidal or violent ideations, also specifically also denies any homicidal or violent ideations towards his spouse   Memory:  recent and remote grossly intact  Judgement:  Other:  improving   Insight:  improved   Psychomotor Activity:  Normal  Concentration:  Good  Recall:  Good  Fund of Knowledge:Good  Language: Good  Akathisia:  Negative  Handed:  Right  AIMS (if indicated):   no abnormal or involuntary movements noted or reported   Assets:  Communication  Skills Desire for Improvement Resilience  Sleep:  Number of Hours: 6(time change affected)  Cognition: WNL  ADL's:  Intact   Mental Status Per Nursing Assessment::   On Admission:     Demographic Factors:  37 year old married male, has two children, employed   Loss Factors: Chronic marital difficulties, states currently in process of separation  Historical Factors: History of Bipolar Disorder. History of prior psychiatric admissions , denies history of suicide attempts, denies history of violence   Risk Reduction Factors:   Sense of responsibility to family, Employed and Positive coping skills or problem solving skills  Continued Clinical Symptoms:  At this time patient is alert, attentive, well related, reports improving mood and presents with improving range of affect. No thought disorder, denies suicidal ideations, denies homicidal or violent ideations, no hallucinations, no delusions.  Behavior on unit in good control. Presents calm and pleasant on approach.  Denies medication side effects. We reviewed side effects, including risk of of severe rash on Lamictal and risk of motor side effects/metabolic side effects on Latuda .  Cognitive Features That Contribute To Risk:  No gross cognitive deficits noted upon discharge. Is alert , attentive, and oriented x 3   Suicide Risk:  Mild:  Suicidal ideation of limited frequency, intensity, duration, and specificity.  There are no identifiable plans, no associated intent, mild dysphoria and related symptoms, good self-control (both objective and subjective assessment), few other risk factors, and identifiable protective factors, including available and accessible social support.  Follow-up Information    [facility].   Why:  Upcoming therapy appointment is  this coming week with Ree Kida.  Please call to confirm the day and time.  Medication management appointment will need to be set while you are at the office  for your therapy appointment. Contact information: [address] [place] [address] [phone]           Plan Of Care/Follow-up recommendations:  Activity:  as tolerated  Diet:  Regular  Tests:  NA Other:  See below  Patient is expressing readiness for discharge and there are no current grounds for involuntary commitment  Plans to return home. Plans to follow up as above  Also has a PCP at [facility] for medical issues as needed .  Craige Cotta, MD 08/03/2017, 11:39 AM

## 2017-08-03 NOTE — Discharge Summary (Addendum)
Physician Discharge Summary Note  Patient:  Ria BushDavid Schmall is an 37 y.o., male MRN:  [id number] DOB:  1981/04/14 Patient phone:  [phone] (home)  Patient address:   [address] [place] [id number],  Total Time spent with patient: 20 minutes  Date of Admission:  07/30/2017 Date of Discharge: 08/03/17  Reason for Admission:  Worsening depression with SI  Principal Problem: Bipolar 1 disorder, depressed, severe (HCC) Discharge Diagnoses: Patient Active Problem List   Diagnosis Date Noted  . Bipolar 1 disorder, depressed, severe (HCC) [F31.4] 07/30/2017  . 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 Bipolar disord, crnt episode mixed, severe, w psych features (HCC) [F31.64] 11/12/2016  . Acute cholecystitis [K81.0] 10/03/2015    Past Psychiatric History: 2 previous hospitalizations, currently [facility], Bipolar I  Past Medical History: History reviewed. No pertinent past medical history.  Past Surgical History:  Procedure Laterality Date  . CHOLECYSTECTOMY N/A 10/04/2015   Procedure: LAPAROSCOPIC CHOLECYSTECTOMY;  Surgeon: Axel FillerArmando Ramirez, MD;  Location: [facility] OR;  Service: General;  Laterality: N/A;   Family History: History reviewed. No pertinent family history. Family Psychiatric  History: Father- Bipolar; multiple relatives with depression or anxiety  Social History:  Social History   Substance and Sexual Activity  Alcohol Use No     Social History   Substance and Sexual Activity  Drug Use No    Social History   Socioeconomic History  . Marital status: Married    Spouse name: None  . Number of children: None  . Years of education: None  . Highest education level: None  Social Needs  . Financial resource strain: None  . Food insecurity - worry: None  . Food insecurity - inability: None  . Transportation needs - medical: None  . Transportation needs - non-medical: None  Occupational History  . None  Tobacco Use  . Smoking  status: Never Smoker  . Smokeless tobacco: Never Used  Substance and Sexual Activity  . Alcohol use: No  . Drug use: No  . Sexual activity: Yes    Birth control/protection: None  Other Topics Concern  . None  Social History Narrative  . None    Hospital Course[facility]   [facility] Assessment: 37 year old white male, married, 2 children both boys ages 904 and 2. He reports that last year he was on Lithium after a manic episode and was being tapered off due to feeling like he was in a fog all the time. In October they tried to introduce Abilify but it caused severe nausea and headaches, so they tapered off the Lithium and he has been going to counseling through the [facility]. He is not opposed to any new medications but specifically states no Lithium and that Abilify caused problems. He denies any knowledge of any antidepressants.  He reports that he has had been having a lot of stressors at home. His wife became ill in 2017 and had numerous medical problems which strained their marriage. She became more agitated with his and she is verbally abusive towards him. He feels that the marriage is a toxic relationship and that it will end in marriage and they have tried counseling. 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 He denies any current SI/HI/AVH and contracts for safety.  Recently however, he has felt depressed, lack of motivation, fatigue, increased weight and appetite, SI, and anxiety.  He denies any manic symptoms as well.   Patient remained on the [facility] unit for 3 days and stabilized with medication and therapy. Patient was started on Lamictal 25 mg Daily, Latuda 20 mg Daily, and Trazodone 50 mg QHS PRN. Patient showed improvement with improved mood, affect, sleep, appetite, and interaction. Patient has been seen in the day room interacting with peers and staff appropriately. Patient has been attending  group and participating. Patient denies any SI/HI/AVh and contracts for safety. Patient agrees to follow up at [facility], where he is already established. He is provided with prescriptions for his medications upon discharge.       Physical Findings: AIMS: Facial and Oral Movements Muscles of Facial Expression: None, normal Lips and Perioral Area: None, normal Jaw: None, normal Tongue: None, normal,Extremity Movements Upper (arms, wrists, hands, fingers): None, normal Lower (legs, knees, ankles, toes): None, normal, Trunk Movements Neck, shoulders, hips: None, normal, Overall Severity Severity of abnormal movements (highest score from questions above): None, normal Incapacitation due to abnormal movements: None, normal Patient's awareness of abnormal movements (rate only patient's report): No Awareness, Dental Status Current problems with teeth and/or dentures?: No Does patient usually wear dentures?: No  CIWA:    COWS:     Musculoskeletal: Strength & Muscle Tone: within normal limits Gait & Station: normal Patient leans: N/A  Psychiatric Specialty Exam: Physical Exam  Nursing note and vitals reviewed. Constitutional: He is oriented to person, place, and time. He appears well-developed and well-nourished.  Cardiovascular: Normal rate.  Respiratory: Effort normal.  Musculoskeletal: Normal range of motion.  Neurological: He is alert and oriented to person, place, and time.  Skin: Skin is warm.    Review of Systems  Constitutional: Negative.   HENT: Negative.   Eyes: Negative.   Respiratory: Negative.   Cardiovascular: Negative.   Gastrointestinal: Negative.   Genitourinary: Negative.   Musculoskeletal: Negative.   Skin: Negative.   Neurological: Negative.   Endo/Heme/Allergies: Negative.   Psychiatric/Behavioral: Negative.     Blood pressure 128/80, pulse 72, temperature 97.8 F (36.6 C), temperature source Oral, resp. rate 18, height 6\' 3"  (1.905 m),  weight (!) 139.7 kg (308 lb), SpO2 98 %.Body mass index is 38.5 kg/m.  General Appearance: Casual  Eye Contact:  Good  Speech:  Clear and Coherent and Normal Rate  Volume:  Normal  Mood:  Euthymic  Affect:  Flat  Thought Process:  Goal Directed and Descriptions of Associations: Intact  Orientation:  Full (Time, Place, and Person)  Thought Content:  WDL  Suicidal Thoughts:  No  Homicidal Thoughts:  No  Memory:  Immediate;   Good Recent;   Good Remote;   Good  Judgement:  Good  Insight:  Good  Psychomotor Activity:  Normal  Concentration:  Concentration: Good and Attention Span: Good  Recall:  Good  Fund of Knowledge:  Good  Language:  Good  Akathisia:  No  Handed:  Right  AIMS (if indicated):     Assets:  Communication Skills Desire for Improvement Financial Resources/Insurance Housing Physical Health Social Support Transportation  ADL's:  Intact  Cognition:  WNL  Sleep:  Number of Hours: 6(time change affected)     Have you used any form of tobacco in the last 30 days? (Cigarettes, Smokeless Tobacco, Cigars, and/or Pipes): No  Has this patient used any form of tobacco in the last 30 days? (Cigarettes, Smokeless Tobacco, Cigars, and/or Pipes) Yes, No  Blood Alcohol level:  Lab Results  Component Value Date  ETH <10 07/31/2017   ETH <5 11/14/2016    Metabolic Disorder Labs:  Lab Results  Component Value Date   HGBA1C 5.0 07/31/2017   MPG 96.8 07/31/2017   MPG 103 11/19/2016   Lab Results  Component Value Date   PROLACTIN 60.4 (H) 11/19/2016   Lab Results  Component Value Date   CHOL 149 07/31/2017   TRIG 116 07/31/2017   HDL 36 (L) 07/31/2017   CHOLHDL 4.1 07/31/2017   VLDL 23 07/31/2017   LDLCALC 90 07/31/2017   LDLCALC 52 11/19/2016    See Psychiatric Specialty Exam and Suicide Risk Assessment completed by Attending Physician prior to discharge.  Discharge destination:  Home  Is patient on multiple antipsychotic therapies at discharge:  No    Has Patient had three or more failed trials of antipsychotic monotherapy by history:  No  Recommended Plan for Multiple Antipsychotic Therapies: NA   Allergies as of 08/03/2017   No Known Allergies     Medication List    STOP taking these medications   OMEGA 3 PO     TAKE these medications     Indication  lamoTRIgine 25 MG tablet Commonly known as:  LAMICTAL Take 1 tablet (25 mg total) by mouth daily. For mood control Start taking on:  08/04/2017  Indication:  mood stability   lurasidone 20 MG Tabs tablet Commonly known as:  LATUDA Take 1 tablet (20 mg total) by mouth daily with breakfast. For mood control Start taking on:  08/04/2017  Indication:  mood stability   traZODone 50 MG tablet Commonly known as:  DESYREL Take 1 tablet (50 mg total) by mouth at bedtime and may repeat dose one time if needed.  Indication:  Trouble Sleeping      Follow-up Information    [facility].   Why:  Upcoming therapy appointment is this coming week with Ree Kida.  Please call to confirm the day and time.  Medication management appointment will need to be set while you are at the office for your therapy appointment. Contact information: [address] [place] [address] [phone]           Follow-up recommendations:  Continue activity as tolerated. Continue diet as recommended by your PCP. Ensure to keep all appointments with outpatient providers.  Comments:  Patient is instructed prior to discharge to: Take all medications as prescribed by his/her mental healthcare provider. Report any adverse effects and or reactions from the medicines to his/her outpatient provider promptly. Patient has been instructed & cautioned: To not engage in alcohol and or illegal drug use while on prescription medicines. In the event of worsening symptoms, patient is instructed to call the crisis hotline, 911 and or go to the nearest ED for appropriate evaluation and  treatment of symptoms. To follow-up with his/her primary care provider for your other medical issues, concerns and or health care needs.    Signed: Gerlene Burdock Money, FNP 08/03/2017, 8:53 AM   Patient seen, Suicide Assessment Completed.  Disposition Plan Reviewed

## 2018-07-04 ENCOUNTER — Emergency Department (HOSPITAL_COMMUNITY)
Admission: EM | Admit: 2018-07-04 | Discharge: 2018-07-05 | Disposition: A | Discharge status: Home / Self Care | Payer: BLUE CROSS/BLUE SHIELD | Attending: Emergency Medicine | Admitting: Emergency Medicine

## 2018-07-04 ENCOUNTER — Encounter (HOSPITAL_COMMUNITY): Payer: Self-pay | Admitting: Emergency Medicine

## 2018-07-04 DIAGNOSIS — F309 Manic episode, unspecified: Secondary | ICD-10-CM

## 2018-07-04 DIAGNOSIS — F319 Bipolar disorder, unspecified: Secondary | ICD-10-CM

## 2018-07-04 DIAGNOSIS — F312 Bipolar disorder, current episode manic severe with psychotic features: Secondary | ICD-10-CM | POA: Insufficient documentation

## 2018-07-04 DIAGNOSIS — F1290 Cannabis use, unspecified, uncomplicated: Secondary | ICD-10-CM | POA: Insufficient documentation

## 2018-07-04 DIAGNOSIS — F29 Unspecified psychosis not due to a substance or known physiological condition: Secondary | ICD-10-CM

## 2018-07-04 DIAGNOSIS — Z9049 Acquired absence of other specified parts of digestive tract: Secondary | ICD-10-CM | POA: Insufficient documentation

## 2018-07-04 DIAGNOSIS — F3111 Bipolar disorder, current episode manic without psychotic features, mild: Secondary | ICD-10-CM

## 2018-07-04 DIAGNOSIS — Z79899 Other long term (current) drug therapy: Secondary | ICD-10-CM | POA: Insufficient documentation

## 2018-07-04 LAB — COMPREHENSIVE METABOLIC PANEL
ALT: 32 U/L (ref 0–44)
AST: 31 U/L (ref 15–41)
Albumin: 5.1 g/dL — ABNORMAL HIGH (ref 3.5–5.0)
Alkaline Phosphatase: 47 U/L (ref 38–126)
Anion gap: 12 (ref 5–15)
BUN: 18 mg/dL (ref 6–20)
CO2: 22 mmol/L (ref 22–32)
Calcium: 9.9 mg/dL (ref 8.9–10.3)
Chloride: 105 mmol/L (ref 98–111)
Creatinine, Ser: 0.91 mg/dL (ref 0.61–1.24)
GFR calc Af Amer: >60 mL/min (ref >60)
GFR calc non Af Amer: >60 mL/min (ref >60)
Glucose, Bld: 140 mg/dL — ABNORMAL HIGH (ref 70–99)
Potassium: 3.5 mmol/L (ref 3.5–5.1)
Sodium: 139 mmol/L (ref 135–145)
Total Bilirubin: 0.7 mg/dL (ref 0.3–1.2)
Total Protein: 8.1 g/dL (ref 6.5–8.1)

## 2018-07-04 LAB — ETHANOL: Alcohol, Ethyl (B): <10 mg/dL (ref <10)

## 2018-07-04 LAB — CBC
HEMATOCRIT: 49 % (ref 39.0–52.0)
Hemoglobin: 16.4 g/dL (ref 13.0–17.0)
MCH: 28.9 pg (ref 26.0–34.0)
MCHC: 33.5 g/dL (ref 30.0–36.0)
MCV: 86.4 fL (ref 80.0–100.0)
Platelets: 288 10*3/uL (ref 150–400)
RBC: 5.67 MIL/uL (ref 4.22–5.81)
RDW: 13.6 % (ref 11.5–15.5)
WBC: 11.1 10*3/uL — AB (ref 4.0–10.5)
nRBC: 0 % (ref 0.0–0.2)

## 2018-07-04 LAB — SALICYLATE LEVEL: Salicylate Lvl: <7 mg/dL (ref 2.8–30.0)

## 2018-07-04 LAB — ACETAMINOPHEN LEVEL: Acetaminophen (Tylenol), Serum: <10 ug/mL — ABNORMAL LOW (ref 10–30)

## 2018-07-04 LAB — RAPID URINE DRUG SCREEN, HOSP PERFORMED
Amphetamines: NOT DETECTED
BARBITURATES: NOT DETECTED
Benzodiazepines: NOT DETECTED
COCAINE: NOT DETECTED
Opiates: NOT DETECTED
Tetrahydrocannabinol: POSITIVE — AB

## 2018-07-04 MED ORDER — OXCARBAZEPINE 300 MG PO TABS
300.0000 mg | ORAL_TABLET | Freq: Two times a day (BID) | ORAL | Status: DC
  Administered 2018-07-04 – 2018-07-05 (×3): 300 mg via ORAL
  Filled 2018-07-04 (×3): qty 1

## 2018-07-04 MED ORDER — ASENAPINE MALEATE 5 MG SL SUBL: 10.0000 mg | SUBLINGUAL_TABLET | Freq: Once | SUBLINGUAL | Status: DC

## 2018-07-04 MED ORDER — OLANZAPINE 10 MG PO TABS: 10.0000 mg | ORAL_TABLET | Freq: Once | ORAL | Status: DC

## 2018-07-04 MED ORDER — OLANZAPINE 10 MG PO TABS: 10.0000 mg | ORAL_TABLET | Freq: Once | ORAL | Status: DC | PRN

## 2018-07-04 MED ORDER — ASENAPINE MALEATE 5 MG SL SUBL
10.0000 mg | SUBLINGUAL_TABLET | Freq: Once | SUBLINGUAL | Status: AC
  Administered 2018-07-04: 10 mg via SUBLINGUAL
  Filled 2018-07-04: qty 2

## 2018-07-04 MED ORDER — LORAZEPAM 1 MG PO TABS
1.0000 mg | ORAL_TABLET | Freq: Once | ORAL | Status: AC
  Administered 2018-07-04: 1 mg via ORAL
  Filled 2018-07-04: qty 1

## 2018-07-04 NOTE — BH Assessment (Signed)
[facility] Assessment Progress Note  Case was staffed with Lord DNP who recommended patient be observed and monitored.         

## 2018-07-04 NOTE — ED Triage Notes (Signed)
Patient here from home brought in by mom. Patient is off meds, manic behavior. Patient is currently drinking water out of a urine cup when asked to provide urine. When asked why he's here he states "this is where the party at". When asked if he is SI, patient states I don't know.

## 2018-07-04 NOTE — BH Assessment (Addendum)
Assessment Note  Ronald Fritz is an 38 y.o. male that presents this date voluntary with bizarre behaviors and is observed to be highly disorganized. Patient denies any S/I, H/I or VH. Patient admits to active AH as evidenced by patient reporting he hears "sounds" although is not able to elaborate on content. Patient does not appear to be responding to any internal stimuli and can answer questions when redirected. Patient is oriented x 4 and speaks in a loud pressured voice. Patient does not seem to process the content of this writer's questions at times and is makes poor eye contact. Patient denies any current SA use with UDS pending although has a history of Cannabis use per chart review. Patient's mother is present who provides collateral information with patient's permission. Mother currently resides in [place], [place] and arrived in [place] today after receiving multiple call from friends and neighbors who reported patient had been "acting strange." Patient reports one prior attempt at self harm and per chart was last seen at [facility] on 07/31/17 when he presented with S/I. Patient reports he was diagnosed with being Bipolar but is vague in reference to time frame or symptoms. Patient states he had been receiving OP services from the [facility] in [place] who assisted with medication management. Patient is unclear when he last that saw that provider and has not been on any MH medications for "some time" per patient. Patient reports ongoing symptoms to include excessive fatigue stating he has been sleeping only "a couple hours a night" for the last two weeks. Patient voices that he is having racing thoughts and starts pacing around room with random flight of ideas such as "church is my family" and "the man in charge" and wants to "have everything checked." Patient is observed to be hyper religious at times and difficult to redirect. He is easily distracted reporting poor sleep for several months, but  worse over the last 72 hours. When asked about current situation he responds with illogical comments, but has incongruent behavior of pacing around room complaining of right thigh pain that just started. He denies injury which mom endorses. No limp noted. He is observed to be easily distracted, with minimal eye contact with labile mood oscillating between tired to pensive, and agitated. He denies current suicidal or homicidal ideations, but mom reports previous attempts in last year. Patient is a poor historian and renders conflicting history at times. Per note review staff RN reports on admission, patient is off medications presenting with manic behavior. Patient is currently drinking water out of a urine cup when asked to provide urine. When asked why he's here he states "this is where the party at". Case was staffed with Shaune Pollack DNP who recommended patient be observed and monitored.    Diagnosis: F31.2 Bipolar disorder current episode depressed with psychotic features. Cannabis use   Past Medical History: History reviewed. No pertinent past medical history.  Past Surgical History:  Procedure Laterality Date  . CHOLECYSTECTOMY N/A 10/04/2015   Procedure: LAPAROSCOPIC CHOLECYSTECTOMY;  Surgeon: Axel Filler, MD;  Location: [facility] OR;  Service: General;  Laterality: N/A;    Family History: No family history on file.  Social History:  reports that he has never smoked. He has never used smokeless tobacco. He reports that he does not drink alcohol or use drugs.  Additional Social History:  Alcohol / Drug Use Pain Medications: please see mar Prescriptions: please see mar Over the Counter: please see mar History of alcohol / drug use?: No  history of alcohol / drug abuse Longest period of sobriety (when/how long): NA Negative Consequences of Use: (Denies) Withdrawal Symptoms: (Denies)  CIWA: CIWA-Ar BP: (!) 142/89 Pulse Rate: (!) 102 COWS:    Allergies: No Known Allergies  Home Medications: (Not  in a hospital admission)   OB/GYN Status:  No LMP for male patient.  General Assessment Data Location of Assessment: [facility] ED TTS Assessment: In system Is this a Tele or Face-to-Face Assessment?: Face-to-Face Is this an Initial Assessment or a Re-assessment for this encounter?: Initial Assessment Patient Accompanied by:: N/A Language Other than English: No Living Arrangements: Other (Comment)(Extended stay motel) What gender do you identify as?: Male Marital status: Separated Maiden name: NA Living Arrangements: Alone Can pt return to current living arrangement?: Yes Admission Status: Voluntary Is patient capable of signing voluntary admission?: Yes Referral Source: Self/Family/Friend Insurance type: Self Pay     Crisis Care Plan Living Arrangements: Alone Legal Guardian: (NA) [facility]iatrist[facility] Name of Therapist: None  Education Status Is patient currently in school?: No Is the patient employed, unemployed or receiving disability?: Unemployed  Risk to self with the past 6 months Suicidal Ideation: No Has patient been a risk to self within the past 6 months prior to admission? : Yes Suicidal Intent: No Has patient had any suicidal intent within the past 6 months prior to admission? : Yes Is patient at risk for suicide?: Yes Suicidal Plan?: No Has patient had any suicidal plan within the past 6 months prior to admission? : No Access to Means: No What has been your use of drugs/alcohol within the last 12 months?: Denies Previous Attempts/Gestures: No How many times?: 1 Other Self Harm Risks: (Off medications) Triggers for Past Attempts: Family contact(Contact with wife seperated) Intentional Self Injurious Behavior: None Family Suicide History: No Recent stressful life event(s): Other (Comment)(Recent seperation) Persecutory voices/beliefs?: No Depression: Yes Depression Symptoms: Isolating, Feeling worthless/self pity Substance abuse history  and/or treatment for substance abuse?: No Suicide prevention information given to non-admitted patients: Not applicable  Risk to Others within the past 6 months Homicidal Ideation: No Does patient have any lifetime risk of violence toward others beyond the six months prior to admission? : No Thoughts of Harm to Others: No Current Homicidal Intent: No Current Homicidal Plan: No Access to Homicidal Means: No Identified Victim: NA History of harm to others?: No Assessment of Violence: None Noted Violent Behavior Description: NA Does patient have access to weapons?: No Criminal Charges Pending?: No Does patient have a court date: No Is patient on probation?: No  Psychosis Hallucinations: Auditory Delusions: None noted  Mental Status Report Appearance/Hygiene: In scrubs Eye Contact: Fair Motor Activity: Freedom of movement Speech: Rapid, Pressured Level of Consciousness: Restless Mood: Anxious Affect: Preoccupied Anxiety Level: Severe Thought Processes: Flight of Ideas Judgement: Impaired Orientation: Person, Place, Time Obsessive Compulsive Thoughts/Behaviors: None  Cognitive Functioning Concentration: Decreased Memory: Recent Impaired Is patient IDD: No Insight: Fair Impulse Control: Poor Appetite: Good Have you had any weight changes? : No Change Sleep: Decreased Total Hours of Sleep: 7 Vegetative Symptoms: None  ADLScreening [facility] Assessment Services) Patient's cognitive ability adequate to safely complete daily activities?: Yes Patient able to express need for assistance with ADLs?: Yes Independently performs ADLs?: Yes (appropriate for developmental age)  Prior Inpatient Therapy Prior Inpatient Therapy: Yes Prior Therapy Dates: 2019 [facility] Reason for Treatment: MH issues  Prior Outpatient Therapy Prior Outpatient Therapy: Yes Prior Therapy Dates: Ongoing [facility]  Reason for  Treatment: Med mang Does patient have an ACCT team?: No Does patient have Intensive In-House Services?  : No Does patient have [facility] services? : No Does patient have P4CC services?: No  ADL Screening (condition at time of admission) Patient's cognitive ability adequate to safely complete daily activities?: Yes Is the patient deaf or have difficulty hearing?: No Does the patient have difficulty seeing, even when wearing glasses/contacts?: No Does the patient have difficulty concentrating, remembering, or making decisions?: No Patient able to express need for assistance with ADLs?: Yes Does the patient have difficulty dressing or bathing?: No Independently performs ADLs?: Yes (appropriate for developmental age) Does the patient have difficulty walking or climbing stairs?: No Weakness of Legs: None Weakness of Arms/Hands: None  Home Assistive Devices/Equipment Home Assistive Devices/Equipment: None  Therapy Consults (therapy consults require a physician order) PT Evaluation Needed: No OT Evalulation Needed: No SLP Evaluation Needed: No Abuse/Neglect Assessment (Assessment to be complete while patient is alone) Physical Abuse: Denies Verbal Abuse: Denies Sexual Abuse: Denies Exploitation of patient/patient's resources: Denies Self-Neglect: Denies Values / Beliefs Cultural Requests During Hospitalization: None Spiritual Requests During Hospitalization: None Consults Spiritual Care Consult Needed: No Social Work Consult Needed: No Merchant navy officer (For Healthcare) Does Patient Have a Medical Advance Directive?: No Would patient like information on creating a medical advance directive?: No - Patient declined          Disposition: Case was staffed with Shaune Pollack DNP who recommended patient be observed and monitored.    Disposition Initial Assessment Completed for this Encounter: Yes Disposition of Patient: (Observe and monitor) Patient refused recommended treatment: No Mode  of transportation if patient is discharged/movement?: (Unk)  On Site Evaluation by:   Reviewed with Physician:    Alfredia Ferguson 07/04/2018 1:42 PM

## 2018-07-04 NOTE — ED Provider Notes (Signed)
[facility]-EMERGENCY DEPT Provider Note   CSN: [id number] Arrival date & time: 07/04/18  1008     History   Chief Complaint Chief Complaint  Patient presents with  . Medical Clearance    HPI Ronald Fritz is a 38 y.o. male.  HPI Patient is brought in with known history of bipolar disorder with prior episodes of a depressive cycle as well as a manic cycle.  Patient's mother has come in from out of town to be with him.  She reports that she is in daily contact by face time.  She has been observing him to have increasingly erratic behaviors and evidence of increasing mania.  Neighbors have witnessed unusual and erratic behaviors.  Patient's mother reports that she is seeing him similar one other time.  She also reports that the patient's father is bipolar and she is very accustomed to these episodes.  She reports she has not appeared ill or sick.  She has been in daily contact.  There is been no report of fever.  She is noted him to have any type or respiratory symptoms he is had no pain complaints.  She reports he has not really had sick contacts.  His son had a fever but that was due to ear infection no known influenza contact.  Interviewing the patient, he is scattered and vague in his explanations.  When asked about his symptoms he reports that he needs to get some help with sleep.  He cannot remember his sleep schedule or the last time he was actually asleep.  All responses are vague and tangential without specific content. History reviewed. No pertinent past medical history.  Patient Active Problem List   Diagnosis Date Noted  . Bipolar 1 disorder, depressed, severe (HCC) 07/30/2017  . Affective psychosis, bipolar (HCC) 11/15/2016  . Bipolar disord, crnt episode mixed, severe, w psych features (HCC) 11/12/2016  . Acute cholecystitis 10/03/2015    Past Surgical History:  Procedure Laterality Date  . CHOLECYSTECTOMY N/A 10/04/2015   Procedure: LAPAROSCOPIC  CHOLECYSTECTOMY;  Surgeon: Axel Filler, MD;  Location: [facility] OR;  Service: General;  Laterality: N/A;        Home Medications    Prior to Admission medications   Medication Sig Start Date End Date Taking? Authorizing Provider  Omega-3 Fatty Acids (FISH OIL) 1000 MG CAPS Take 2,000 mg by mouth daily.   Yes [provider]  lamoTRIgine (LAMICTAL) 25 MG tablet Take 1 tablet (25 mg total) by mouth daily. For mood control Patient not taking: Reported on 07/04/2018 08/04/17   Money, Gerlene Burdock, FNP  lurasidone (LATUDA) 20 MG TABS tablet Take 1 tablet (20 mg total) by mouth daily with breakfast. For mood control Patient not taking: Reported on 07/04/2018 08/04/17   Money, Gerlene Burdock, FNP  traZODone (DESYREL) 50 MG tablet Take 1 tablet (50 mg total) by mouth at bedtime and may repeat dose one time if needed. Patient not taking: Reported on 07/04/2018 08/03/17   Money, Gerlene Burdock, FNP    Family History No family history on file.  Social History Social History   Tobacco Use  . Smoking status: Never Smoker  . Smokeless tobacco: Never Used  Substance Use Topics  . Alcohol use: No  . Drug use: No     Allergies   Patient has no known allergies.   Review of Systems Review of Systems Level 5 caveat cannot obtain due to patient tangential thought processes  Physical Exam Updated Vital Signs BP (!) 142/89 (  BP Location: Right Arm)   Pulse (!) 102   Temp 98.5 F (36.9 C) (Oral)   Resp 19   SpO2 100%   Physical Exam Constitutional:      Comments: Patient is clinically well in appearance.  He is alert and nontoxic.  He is intermittently answering questions but sometimes has poor eye contact.  His answers are vague and tangential.  HENT:     Head: Normocephalic and atraumatic.     Nose: Nose normal.     Mouth/Throat:     Mouth: Mucous membranes are moist.  Eyes:     Extraocular Movements: Extraocular movements intact.     Conjunctiva/sclera: Conjunctivae normal.  Neck:      Musculoskeletal: Neck supple.  Cardiovascular:     Rate and Rhythm: Normal rate and regular rhythm.  Pulmonary:     Effort: Pulmonary effort is normal.     Breath sounds: Normal breath sounds.  Abdominal:     General: There is no distension.     Palpations: Abdomen is soft.     Tenderness: There is no abdominal tenderness. There is no guarding.  Musculoskeletal: Normal range of motion.     Comments: No peripheral edema.  Skin condition very good.  Calf soft nontender.  Patient has well-developed musculature.  Patient is variably been up and ambulatory with coordinated gait.  Skin:    General: Skin is warm and dry.     Findings: No rash.  Neurological:     General: No focal deficit present.     Coordination: Coordination normal.     Comments: Patient's movements are coordinated and purposeful.  He is ambulatory.  He is not tremulous.  He vacillates from being withdrawn and quiet to slightly agitated and pacing about.  At the time of my last exam, patient is resting quietly.  Patient's mother reports that he has now rested for a while.      ED Treatments / Results  Labs (all labs ordered are listed, but only abnormal results are displayed) Labs Reviewed  COMPREHENSIVE METABOLIC PANEL - Abnormal; Notable for the following components:      Result Value   Glucose, Bld 140 (*)    Albumin 5.1 (*)    All other components within normal limits  ACETAMINOPHEN LEVEL - Abnormal; Notable for the following components:   Acetaminophen (Tylenol), Serum <10 (*)    All other components within normal limits  CBC - Abnormal; Notable for the following components:   WBC 11.1 (*)    All other components within normal limits  ETHANOL  SALICYLATE LEVEL  RAPID URINE DRUG SCREEN, HOSP PERFORMED    EKG None  Radiology No results found.  Procedures Procedures (including critical care time)  Medications Ordered in ED Medications  Oxcarbazepine (TRILEPTAL) tablet 300 mg (300 mg Oral Given  07/04/18 1331)  asenapine (SAPHRIS) sublingual tablet 10 mg (has no administration in time range)  LORazepam (ATIVAN) tablet 1 mg (1 mg Oral Given 07/04/18 1331)     Initial Impression / Assessment and Plan / ED Course  I have reviewed the triage vital signs and the nursing notes.  Pertinent labs & imaging results that were available during my care of the patient were reviewed by me and considered in my medical decision making (see chart for details).    Patient has been off of his medications for a number of months per his mother.  He had been managing relatively well but there is been a decline over a number  of days to weeks with patient sleeping very little and increasingly tangential in his thoughts and actions.  Patient's mother came today due to concerns for decompensating bipolar disorder.  She has had regular contact with him and not noted any signs of general illness.  Patient clinically is well in appearance.  Medically cleared at this time for further treatment and management of bipolar disorder with psychosis.  Final Clinical Impressions(s) / ED Diagnoses   Final diagnoses:  Mania (HCC)  Bipolar 1 disorder (HCC)  Psychosis, unspecified psychosis type [facility](HCC)    ED Discharge Orders    None       Arby BarrettePfeiffer, Araly Kaas, MD 07/04/18 1551

## 2018-07-04 NOTE — ED Notes (Signed)
After wandering around the unit for a while, pt is in bed sleeping.

## 2018-07-04 NOTE — ED Notes (Signed)
Patient stating that he cannot give blood right now.

## 2018-07-04 NOTE — ED Notes (Signed)
Bed: [facility]40 Expected date:  Expected time:  Means of arrival:  Comments: 

## 2018-07-04 NOTE — ED Notes (Signed)
Pt hyper-religious. Asked this Clinical research associatewriter, "Do you accept Jesus as your lord and savior?"

## 2018-07-04 NOTE — ED Notes (Signed)
Not able to collet urine from pt

## 2018-07-05 DIAGNOSIS — F3111 Bipolar disorder, current episode manic without psychotic features, mild: Secondary | ICD-10-CM | POA: Diagnosis present

## 2018-07-05 MED ORDER — OXCARBAZEPINE 300 MG PO TABS: 300.0000 mg | ORAL_TABLET | Freq: Two times a day (BID) | ORAL | 0 refills | Status: DC

## 2018-07-05 MED ORDER — ASENAPINE MALEATE 5 MG SL SUBL: 5.0000 mg | SUBLINGUAL_TABLET | Freq: Every day | SUBLINGUAL | 0 refills | Status: DC

## 2018-07-05 MED ORDER — ASENAPINE MALEATE 5 MG SL SUBL: 5.0000 mg | SUBLINGUAL_TABLET | Freq: Every day | SUBLINGUAL | Status: DC

## 2018-07-05 NOTE — Progress Notes (Addendum)
Received Cash at the change of shift asleep in his room and  in bed. He was awaken for VS and medication administration. He continued to sleep throughout the night.

## 2018-07-05 NOTE — Consult Note (Addendum)
[facility] Psych ED Discharge  07/05/2018 11:59 AM Ronald Fritz  MRN:  [id number] Principal Problem: Bipolar affective disorder, currently manic, mild [facility]) Discharge Diagnoses: Principal Problem:   Bipolar affective disorder, currently manic, mild (HCC)  Subjective: 39 yo male who presented to the ED yesterday after not sleeping for a few days and having hypomania.  His mother drove here from [place] to bring him to the hospital.  He has been stressed with his divorce which contributed to his sleeplessness which increases his mood.  No suicidal/homicidal ideations, hallucinations, or substance abuse.  [facility].  Saphris was started yesterday as he was barely holding it together.  Slept from about 4 pm to 630 am today and his symptoms improved greatly.  Calm, cooperative, with 2/10 anxiety.  Anxiety increases with stress and decreases with sleep.  His mother visited and would like him to be discharged and the patient would like to also.  She is going to stay with him the next two days to help him.  Requested a work Physicist, medical, provided.  Rx given for Saphris and his regular Trileptal.  Stable for discharge.  Total Time spent with patient: 45 minutes  Past Psychiatric History: bipolar disorder  Past Medical History: History reviewed. No pertinent past medical history.  Past Surgical History:  Procedure Laterality Date  . CHOLECYSTECTOMY N/A 10/04/2015   Procedure: LAPAROSCOPIC CHOLECYSTECTOMY;  Surgeon: Axel Filler, MD;  Location: [facility] OR;  Service: General;  Laterality: N/A;   Family History: No family history on file. Family Psychiatric  History: none Social History:  Social History   Substance and Sexual Activity  Alcohol Use No     Social History   Substance and Sexual Activity  Drug Use No    Social History   Socioeconomic History  . Marital status: Married    Spouse name: Not on file  . Number of children: Not on file  . Years of  education: Not on file  . Highest education level: Not on file  Occupational History  . Not on file  Social Needs  . Financial resource strain: Not on file  . Food insecurity:    Worry: Not on file    Inability: Not on file  . Transportation needs:    Medical: Not on file    Non-medical: Not on file  Tobacco Use  . Smoking status: Never Smoker  . Smokeless tobacco: Never Used  Substance and Sexual Activity  . Alcohol use: No  . Drug use: No  . Sexual activity: Yes    Birth control/protection: None  Lifestyle  . Physical activity:    Days per week: Not on file    Minutes per session: Not on file  . Stress: Not on file  Relationships  . Social connections:    Talks on phone: Not on file    Gets together: Not on file    Attends religious service: Not on file    Active member of club or organization: Not on file    Attends meetings of clubs or organizations: Not on file    Relationship status: Not on file  Other Topics Concern  . Not on file  Social History Narrative  . Not on file    Has this patient used any form of tobacco in the last 30 days? (Cigarettes, Smokeless Tobacco, Cigars, and/or Pipes) NA  Current Medications: Current Facility-Administered Medications  Medication Dose Route Frequency Provider Last Rate Last Dose  .  asenapine (SAPHRIS) sublingual tablet 5 mg  5 mg Sublingual QHS Charm RingsLord, Jamison Y, NP      . Oxcarbazepine (TRILEPTAL) tablet 300 mg  300 mg Oral BID Charm RingsLord, Jamison Y, NP   300 mg at 07/05/18 1051   Current Outpatient Medications  Medication Sig Dispense Refill  . asenapine (SAPHRIS) 5 MG SUBL 24 hr tablet Place 1 tablet (5 mg total) under the tongue at bedtime. 30 tablet 0  . Oxcarbazepine (TRILEPTAL) 300 MG tablet Take 1 tablet (300 mg total) by mouth 2 (two) times daily. 60 tablet 0   PTA Medications: (Not in a hospital admission)   Musculoskeletal: Strength & Muscle Tone: within normal limits Gait & Station: normal Patient leans:  N/A  Psychiatric Specialty Exam: Physical Exam  Nursing note and vitals reviewed. Constitutional: He is oriented to person, place, and time. He appears well-developed and well-nourished.  HENT:  Head: Normocephalic.  Respiratory: Effort normal.  Musculoskeletal: Normal range of motion.  Neurological: He is alert and oriented to person, place, and time.  Psychiatric: His speech is normal and behavior is normal. Judgment and thought content normal. His mood appears anxious. His affect is blunt. Cognition and memory are normal.    Review of Systems  Psychiatric/Behavioral: The patient is nervous/anxious.   All other systems reviewed and are negative.   Blood pressure 123/87, pulse 85, temperature 98 F (36.7 C), temperature source Oral, resp. rate 18, weight 122.5 kg, SpO2 99 %.Body mass index is 33.75 kg/m.  General Appearance: Casual  Eye Contact:  Good  Speech:  Normal Rate  Volume:  Normal  Mood:  Anxious  Affect:  Blunt  Thought Process:  Coherent and Descriptions of Associations: Intact  Orientation:  Full (Time, Place, and Person)  Thought Content:  WDL and Logical  Suicidal Thoughts:  No  Homicidal Thoughts:  No  Memory:  Immediate;   Good Recent;   Good Remote;   Good  Judgement:  Good  Insight:  Good  Psychomotor Activity:  Normal  Concentration:  Concentration: Good and Attention Span: Good  Recall:  Good  Fund of Knowledge:  Good  Language:  Good  Akathisia:  No  Handed:  Right  AIMS (if indicated):     Assets:  Housing Leisure Time Physical Health Resilience Social Support Vocational/Educational  ADL's:  Intact  Cognition:  WNL  Sleep:        Demographic Factors:  Male and Caucasian  Loss Factors: NA  Historical Factors: NA  Risk Reduction Factors:   Sense of responsibility to family, Living with another person, especially a relative, Positive social support and Positive therapeutic relationship  Continued Clinical Symptoms:  Anxiety,  mild  Cognitive Features That Contribute To Risk:  None    Suicide Risk:  Minimal: No identifiable suicidal ideation.  Patients presenting with no risk factors but with morbid ruminations; may be classified as minimal risk based on the severity of the depressive symptoms   Plan Of Care/Follow-up recommendations:  Bipolar affective disorder, mania, mild: -Continued Trileptal 300 mg BId -Started Saphris 5 mg at bedtime -Rx provided  Activity:  as tolerated Diet:  heart healthy diet  Disposition: discharge home Nanine MeansLORD, JAMISON, NP 07/05/2018, 11:59 AM  Patient seen face-to-face for psychiatric evaluation, chart reviewed and case discussed with the physician extender and developed treatment plan. Reviewed the information documented and agree with the treatment plan. Thedore MinsMojeed Harbert Fitterer, MD

## 2018-07-05 NOTE — ED Notes (Signed)
Pt discharged safely with mother.  NP reviewed all discharge instructions and RX.  Pt was in no distress. All belongings were returned to pt.

## 2018-07-09 ENCOUNTER — Encounter (HOSPITAL_COMMUNITY): Payer: Self-pay | Admitting: Emergency Medicine

## 2018-07-09 ENCOUNTER — Emergency Department (HOSPITAL_COMMUNITY)
Admission: EM | Admit: 2018-07-09 | Discharge: 2018-07-11 | Disposition: A | Discharge status: Other Acute Inpatient Hospital | Payer: Self-pay | Attending: Emergency Medicine | Admitting: Emergency Medicine

## 2018-07-09 DIAGNOSIS — F3162 Bipolar disorder, current episode mixed, moderate: Secondary | ICD-10-CM | POA: Diagnosis present

## 2018-07-09 DIAGNOSIS — F23 Brief psychotic disorder: Secondary | ICD-10-CM | POA: Diagnosis present

## 2018-07-09 DIAGNOSIS — F315 Bipolar disorder, current episode depressed, severe, with psychotic features: Secondary | ICD-10-CM | POA: Insufficient documentation

## 2018-07-09 HISTORY — DX: Bipolar disorder, unspecified: F31.9

## 2018-07-09 LAB — COMPREHENSIVE METABOLIC PANEL
ALK PHOS: 44 U/L (ref 38–126)
ALT: 35 U/L (ref 0–44)
AST: 43 U/L — ABNORMAL HIGH (ref 15–41)
Albumin: 4.6 g/dL (ref 3.5–5.0)
Anion gap: 10 (ref 5–15)
BUN: 13 mg/dL (ref 6–20)
CO2: 25 mmol/L (ref 22–32)
Calcium: 9.4 mg/dL (ref 8.9–10.3)
Chloride: 104 mmol/L (ref 98–111)
Creatinine, Ser: 0.9 mg/dL (ref 0.61–1.24)
GFR calc Af Amer: >60 mL/min (ref >60)
GFR calc non Af Amer: >60 mL/min (ref >60)
Glucose, Bld: 102 mg/dL — ABNORMAL HIGH (ref 70–99)
Potassium: 3.5 mmol/L (ref 3.5–5.1)
SODIUM: 139 mmol/L (ref 135–145)
Total Bilirubin: 1.5 mg/dL — ABNORMAL HIGH (ref 0.3–1.2)
Total Protein: 7.2 g/dL (ref 6.5–8.1)

## 2018-07-09 LAB — CBC
HCT: 43.4 % (ref 39.0–52.0)
Hemoglobin: 14.5 g/dL (ref 13.0–17.0)
MCH: 29.1 pg (ref 26.0–34.0)
MCHC: 33.4 g/dL (ref 30.0–36.0)
MCV: 87.1 fL (ref 80.0–100.0)
Platelets: 246 10*3/uL (ref 150–400)
RBC: 4.98 MIL/uL (ref 4.22–5.81)
RDW: 13.2 % (ref 11.5–15.5)
WBC: 10.6 10*3/uL — AB (ref 4.0–10.5)
nRBC: 0 % (ref 0.0–0.2)

## 2018-07-09 LAB — ETHANOL: Alcohol, Ethyl (B): <10 mg/dL (ref <10)

## 2018-07-09 MED ORDER — ONDANSETRON HCL 4 MG PO TABS: 4.0000 mg | ORAL_TABLET | Freq: Three times a day (TID) | ORAL | Status: DC | PRN

## 2018-07-09 MED ORDER — ASENAPINE MALEATE 5 MG SL SUBL
5.0000 mg | SUBLINGUAL_TABLET | Freq: Every day | SUBLINGUAL | Status: DC
  Administered 2018-07-09: 5 mg via SUBLINGUAL
  Filled 2018-07-09: qty 1

## 2018-07-09 MED ORDER — STERILE WATER FOR INJECTION IJ SOLN
INTRAMUSCULAR | Status: AC
  Administered 2018-07-09: 1.2 mL
  Filled 2018-07-09: qty 10

## 2018-07-09 MED ORDER — DIPHENHYDRAMINE HCL 50 MG/ML IJ SOLN
50.0000 mg | Freq: Once | INTRAMUSCULAR | Status: AC
  Administered 2018-07-09: 50 mg via INTRAMUSCULAR
  Filled 2018-07-09: qty 1

## 2018-07-09 MED ORDER — SUCROSE 24% NICU/PEDS ORAL SOLUTION
OROMUCOSAL | Status: AC
  Filled 2018-07-09: qty 0.5

## 2018-07-09 MED ORDER — LORAZEPAM 2 MG/ML IJ SOLN
2.0000 mg | Freq: Once | INTRAMUSCULAR | Status: AC
  Administered 2018-07-09: 2 mg via INTRAMUSCULAR
  Filled 2018-07-09: qty 1

## 2018-07-09 MED ORDER — OXCARBAZEPINE 300 MG PO TABS
300.0000 mg | ORAL_TABLET | Freq: Two times a day (BID) | ORAL | Status: DC
  Administered 2018-07-09 – 2018-07-11 (×4): 300 mg via ORAL
  Filled 2018-07-09 (×5): qty 1

## 2018-07-09 MED ORDER — ZIPRASIDONE MESYLATE 20 MG IM SOLR
20.0000 mg | Freq: Once | INTRAMUSCULAR | Status: AC
  Administered 2018-07-09: 20 mg via INTRAMUSCULAR
  Filled 2018-07-09: qty 20

## 2018-07-09 MED ORDER — ALUM & MAG HYDROXIDE-SIMETH 200-200-20 MG/5ML PO SUSP: 30.0000 mL | Freq: Four times a day (QID) | ORAL | Status: DC | PRN

## 2018-07-09 MED ORDER — ACETAMINOPHEN 325 MG PO TABS: 650.0000 mg | ORAL_TABLET | ORAL | Status: DC | PRN
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
## 2018-07-09 NOTE — ED Notes (Signed)
Moms number:Kathie Kolton [phone]

## 2018-07-09 NOTE — ED Notes (Signed)
Patient was trying to ambulate out of room, patient asked to stay in room. He said, "You are the boss. Ronald Fritz. Go plaid!" Patient sat back in room.

## 2018-07-09 NOTE — ED Notes (Signed)
Patient escorted back to his room.  Patient cooperative at this time saying "all I want to do is sleep."  Patient lying down in his bed with his eyes closed.  TV turned off so patient can rest.

## 2018-07-09 NOTE — ED Notes (Signed)
Pt unable to answer questions. Pt  noted on the unit screaming loudly. Reporting that's he's crazy and the dirties motherfucker. Pt given geodon 20 mg in the right arm. Pt will safe, will continue to monitor.

## 2018-07-09 NOTE — ED Triage Notes (Signed)
Pt BIB [facility] voluntarily.  Pt chanting religious beliefs.  "Are you going to treat me like king soldier? Everybody's got the good news.  We can celebrate".  Denies SI.  Pt states that he lives at home with wife and two children but states that he "doesn't know where they are because he is not a Archivist".
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
## 2018-07-09 NOTE — ED Notes (Addendum)
Patient standing in room calm but responding to internal stimuli, disorganized thought, and flight of ideas.  Patient was observed earlier in bathroom acting like he was unable to get out of the bathroom.  Assisted patient back to his room.

## 2018-07-09 NOTE — ED Notes (Addendum)
Patient in bathroom again.  Given urine cup to provide specimen.  Patient unable to provide specimen.

## 2018-07-09 NOTE — ED Notes (Signed)
Patient was observed by Engineer, materials walking on [employer] yesterday.

## 2018-07-09 NOTE — ED Notes (Signed)
Bed: [facility] Expected date:  Expected time:  Means of arrival:  Comments: rm 16

## 2018-07-09 NOTE — ED Notes (Signed)
Patient in room alone stating, "You are all miracle workers, this is where the party is."

## 2018-07-09 NOTE — Progress Notes (Addendum)
Patient ID: Ronald Fritz, male   DOB: 1981-05-27, 38 y.o.   MRN: [id number]  Pt observed face to face in the seclusion room at 1058, in no apparent distress. Pt was being disruptive on th unit, yelling, screaming and busting through the doors to his room. For his safety and patients and staff on the unit he was placed in the seclusion room. He was administered emergency medications prior to being placed in seclusion.   Laveda Abbe,  FNP-C 07/09/2018    1134   Patient seen face-to-face for psychiatric evaluation, chart reviewed and case discussed with the physician extender and developed treatment plan. Reviewed the information documented and agree with the treatment plan.  Juanetta Beets, DO 07/09/18 9:51 PM

## 2018-07-09 NOTE — BH Assessment (Signed)
[facility] Assessment Progress Note  Per Juanetta Beets, DO, this pt requires psychiatric hospitalization at this time.  Pt presents under IVC initiated by EDP Meriel Flavors, MD.  This writer will seek placement for pt.  Doylene Canning, [place] Behavioral Health Coordinator [phone]
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
## 2018-07-10 DIAGNOSIS — F23 Brief psychotic disorder: Secondary | ICD-10-CM

## 2018-07-10 DIAGNOSIS — F3162 Bipolar disorder, current episode mixed, moderate: Secondary | ICD-10-CM | POA: Diagnosis present

## 2018-07-10 LAB — RAPID URINE DRUG SCREEN, HOSP PERFORMED
Amphetamines: NOT DETECTED
Barbiturates: NOT DETECTED
Benzodiazepines: NOT DETECTED
COCAINE: NOT DETECTED
Opiates: NOT DETECTED
TETRAHYDROCANNABINOL: POSITIVE — AB

## 2018-07-10 MED ORDER — LORAZEPAM 2 MG/ML IJ SOLN
2.0000 mg | Freq: Once | INTRAMUSCULAR | Status: AC | PRN
  Administered 2018-07-10: 2 mg via INTRAMUSCULAR
  Filled 2018-07-10: qty 1

## 2018-07-10 MED ORDER — DIPHENHYDRAMINE HCL 50 MG/ML IJ SOLN
50.0000 mg | Freq: Once | INTRAMUSCULAR | Status: AC | PRN
  Administered 2018-07-10: 50 mg via INTRAMUSCULAR
  Filled 2018-07-10: qty 1

## 2018-07-10 MED ORDER — CHLORPROMAZINE HCL 25 MG/ML IJ SOLN
50.0000 mg | Freq: Once | INTRAMUSCULAR | Status: AC
  Administered 2018-07-10: 50 mg via INTRAMUSCULAR
  Filled 2018-07-10: qty 2

## 2018-07-10 MED ORDER — STERILE WATER FOR INJECTION IJ SOLN
INTRAMUSCULAR | Status: AC
  Administered 2018-07-10: 12:00:00
  Filled 2018-07-10: qty 10

## 2018-07-10 MED ORDER — DIVALPROEX SODIUM ER 500 MG PO TB24
500.0000 mg | ORAL_TABLET | Freq: Two times a day (BID) | ORAL | Status: DC
  Administered 2018-07-11: 500 mg via ORAL
  Filled 2018-07-10 (×2): qty 1

## 2018-07-10 MED ORDER — ASENAPINE MALEATE 5 MG SL SUBL
5.0000 mg | SUBLINGUAL_TABLET | Freq: Two times a day (BID) | SUBLINGUAL | Status: DC
  Administered 2018-07-11: 5 mg via SUBLINGUAL
  Filled 2018-07-10 (×2): qty 1

## 2018-07-10 MED ORDER — ZIPRASIDONE MESYLATE 20 MG IM SOLR
20.0000 mg | Freq: Once | INTRAMUSCULAR | Status: AC | PRN
  Administered 2018-07-10: 20 mg via INTRAMUSCULAR
  Filled 2018-07-10: qty 20

## 2018-07-10 NOTE — ED Notes (Signed)
Pt sedated, sleeping at present, no distress noted, calm at present.  Monitoring for safety, Q 15 min checks in effect.  Pending report to [facility] and [employer] transport.  Pt is IVC.

## 2018-07-10 NOTE — ED Notes (Signed)
As per [facility], pt scheduled for transfer on 07/11/18 in am.

## 2018-07-10 NOTE — ED Notes (Signed)
Provider at bedside

## 2018-07-10 NOTE — ED Notes (Signed)
Pt mother requesting to speak to a Child psychotherapist, this nurse notified Scientist, forensic.

## 2018-07-10 NOTE — ED Notes (Signed)
Social worker at bedside.
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
## 2018-07-10 NOTE — ED Notes (Signed)
Pt awake, urinating on lunch tray, thought blocking noted, pt actively hallucinating. Tells this nurse he sees snakes under the bed. Encouragement and support provided. This nurse notified Jacki Cones NP of pt behaviors.

## 2018-07-10 NOTE — ED Notes (Signed)
Mother at bedside visiting with pt.  

## 2018-07-10 NOTE — ED Notes (Signed)
Pt requiring frequent redirection and encouragement. Pt tells this nurse he feels confused. Thought blocking and preoccupation noted. "I am just trying to spread the word of Jesus to my friends and family." Jacki Cones NP notified of pt behavior.

## 2018-07-10 NOTE — BH Assessment (Signed)
[facility] Assessment Progress Note  Per Juanetta Beets, DO, this pt requires psychiatric hospitalization.  Berneice Heinrich, RN, [facility] has assigned pt to [facility] Rm 506-1; [facility] will be ready to receive pt at 15:00.  Pt presents under IVC initiated by EDP Meriel Flavors, MD, and IVC documents have been faxed to [facility].  Pt's nurse, Morrie Sheldon, has been notified, and agrees to call report to [phone].  Pt is to be transported via [employer].   Doylene Canning, [place] Behavioral Health Coordinator [phone]

## 2018-07-10 NOTE — Progress Notes (Addendum)
CSW received a call from pt's RN stating pt's mother was awaiting SWK to request information.  CSW met with pt's mother at bedside and was asked about Medicial POA and Legal Guardianship.  Consult request has been received. CSW attempting to follow up at present time.  CSW counseled pt's mother on the above and how they are attained.  Pt's mother was appreciative and thanked the CSW.  RN updated.  Please reconsult if future social work needs arise.  CSW signing off, as social work intervention is no longer needed.  Alphonse Guild. Ayan Heffington, LCSW, LCAS, CSI Clinical Social Worker Ph: [phone]

## 2018-07-10 NOTE — ED Notes (Signed)
Pt taking a shower 

## 2018-07-10 NOTE — ED Notes (Signed)
Pt behavior escalating, increase anxiety,. Yelling "I need a doctor, [facility]."  Pt behavior not redirectable. This nurse notified Jacki Cones NP of pt behavior.

## 2018-07-10 NOTE — ED Notes (Signed)
Patient resting nursing staff advised to hold vitals to allow patient to rest

## 2018-07-10 NOTE — ED Notes (Signed)
Pt sitting in chair, talking to ceiling, repeating "Rice and beans" encouragement and support provided.

## 2018-07-11 ENCOUNTER — Encounter (HOSPITAL_COMMUNITY): Payer: Self-pay

## 2018-07-11 ENCOUNTER — Inpatient Hospital Stay (HOSPITAL_COMMUNITY)
Admission: AD | Admit: 2018-07-11 | Discharge: 2018-07-15 | DRG: 885 | Disposition: A | Discharge status: Home / Self Care | Payer: Federal, State, Local not specified - Other | Source: Intra-hospital | Attending: Psychiatry | Admitting: Psychiatry

## 2018-07-11 ENCOUNTER — Other Ambulatory Visit: Payer: Self-pay

## 2018-07-11 DIAGNOSIS — R41843 Psychomotor deficit: Secondary | ICD-10-CM | POA: Diagnosis present

## 2018-07-11 DIAGNOSIS — Z818 Family history of other mental and behavioral disorders: Secondary | ICD-10-CM | POA: Diagnosis not present

## 2018-07-11 DIAGNOSIS — G4700 Insomnia, unspecified: Secondary | ICD-10-CM | POA: Diagnosis present

## 2018-07-11 DIAGNOSIS — F312 Bipolar disorder, current episode manic severe with psychotic features: Secondary | ICD-10-CM | POA: Diagnosis present

## 2018-07-11 DIAGNOSIS — Z9049 Acquired absence of other specified parts of digestive tract: Secondary | ICD-10-CM

## 2018-07-11 DIAGNOSIS — F1290 Cannabis use, unspecified, uncomplicated: Secondary | ICD-10-CM | POA: Diagnosis present

## 2018-07-11 DIAGNOSIS — Z79899 Other long term (current) drug therapy: Secondary | ICD-10-CM

## 2018-07-11 MED ORDER — TRAZODONE HCL 50 MG PO TABS
50.0000 mg | ORAL_TABLET | Freq: Every evening | ORAL | Status: DC | PRN
  Administered 2018-07-11: 50 mg via ORAL
  Filled 2018-07-11: qty 1

## 2018-07-11 MED ORDER — ALUM & MAG HYDROXIDE-SIMETH 200-200-20 MG/5ML PO SUSP: 30.0000 mL | ORAL | Status: DC | PRN

## 2018-07-11 MED ORDER — ASENAPINE MALEATE 5 MG SL SUBL
5.0000 mg | SUBLINGUAL_TABLET | Freq: Two times a day (BID) | SUBLINGUAL | Status: DC
  Filled 2018-07-11 (×2): qty 1

## 2018-07-11 MED ORDER — OXCARBAZEPINE 300 MG PO TABS
300.0000 mg | ORAL_TABLET | Freq: Two times a day (BID) | ORAL | Status: DC
  Administered 2018-07-11 – 2018-07-13 (×4): 300 mg via ORAL
  Filled 2018-07-11 (×6): qty 1

## 2018-07-11 MED ORDER — DIVALPROEX SODIUM ER 500 MG PO TB24
500.0000 mg | ORAL_TABLET | Freq: Two times a day (BID) | ORAL | Status: DC
  Administered 2018-07-11 – 2018-07-15 (×8): 500 mg via ORAL
  Filled 2018-07-11 (×7): qty 1
  Filled 2018-07-11: qty 14
  Filled 2018-07-11: qty 1
  Filled 2018-07-11: qty 14
  Filled 2018-07-11 (×4): qty 1

## 2018-07-11 MED ORDER — HYDROXYZINE HCL 25 MG PO TABS
25.0000 mg | ORAL_TABLET | Freq: Three times a day (TID) | ORAL | Status: DC | PRN
  Administered 2018-07-12 – 2018-07-14 (×2): 25 mg via ORAL
  Filled 2018-07-11: qty 10
  Filled 2018-07-11 (×2): qty 1

## 2018-07-11 MED ORDER — RISPERIDONE 1 MG PO TABS
1.0000 mg | ORAL_TABLET | Freq: Every day | ORAL | Status: DC
  Administered 2018-07-11: 1 mg via ORAL
  Filled 2018-07-11 (×3): qty 1

## 2018-07-11 MED ORDER — ACETAMINOPHEN 325 MG PO TABS: 650.0000 mg | ORAL_TABLET | Freq: Four times a day (QID) | ORAL | Status: DC | PRN

## 2018-07-11 MED ORDER — MAGNESIUM HYDROXIDE 400 MG/5ML PO SUSP: 30.0000 mL | Freq: Every day | ORAL | Status: DC | PRN

## 2018-07-11 MED ORDER — RISPERIDONE 0.5 MG PO TABS
0.5000 mg | ORAL_TABLET | Freq: Every day | ORAL | Status: DC
  Administered 2018-07-11 – 2018-07-12 (×2): 0.5 mg via ORAL
  Filled 2018-07-11 (×5): qty 1
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
## 2018-07-11 NOTE — Progress Notes (Signed)
The patient verbalized in group that he had a good day for a few reasons. First of all, he stated that he had a good visit with his mother. Secondly, he stated that he learned today that his wife and children are all safe. He also learned today that he might have a new job waiting for him. His goal for tomorrow is to continue working on his relationship with God.

## 2018-07-11 NOTE — Tx Team (Signed)
Initial Treatment Plan 07/11/2018 1:44 PM [name] XVE:[id number]    PATIENT STRESSORS: Financial difficulties Marital or family conflict   PATIENT STRENGTHS: Ability for insight Capable of independent living General fund of knowledge   PATIENT IDENTIFIED PROBLEMS: 1. "Being a better friend"  2. "That's it"                   DISCHARGE CRITERIA:  Adequate post-discharge living arrangements Improved stabilization in mood, thinking, and/or behavior  PRELIMINARY DISCHARGE PLAN: Outpatient therapy  PATIENT/FAMILY INVOLVEMENT: This treatment plan has been presented to and reviewed with the patient, Ronald Fritz.  The patient has been given the opportunity to ask questions and make suggestions.  Kirstie Mirza, RN 07/11/2018, 1:44 PM

## 2018-07-11 NOTE — ED Notes (Signed)
Law enforcement called for transport.  

## 2018-07-11 NOTE — H&P (Signed)
Psychiatric Admission Assessment Adult  Patient Identification: Ronald Fritz MRN:  [id number] Date of Evaluation:  07/11/2018 Chief Complaint:  bipolar 1 disorder current episode depressed Principal Diagnosis: <principal problem not specified> Diagnosis:  Active Problems:   Severe manic bipolar 1 disorder with psychotic behavior (HCC)  History of Present Illness: Patient is seen and examined.  Patient is a 38 year old male with a past psychiatric history significant for bipolar disorder who presented to the [facility] emergency department on 07/10/2018 with psychotic symptoms.  The patient stated he was being admitted to the hospital because his wife and mother were "into conflict, and neither 1 of them believe me".  According to the notes in the emergency department the patient has been exhibiting bizarre and psychotic behavior.  He had been previously on Saphris and Trileptal, but apparently the Saphris was too expensive for him to afford and had not been on it in several months.  He stated he had [facility] and weeks" ago.  The patient admitted to poor sleep, and appears to have had visual hallucinations at home.  He also was found urinating in different containers and seeing snakes under his bed.  He is also in interview today significantly hyper religious.  He does report marijuana use, and realizes "it is against the law".  His last psychiatric hospitalization at our facility was on 07/30/2017.  He also had a hospitalization here on 11/15/2016.  On the hospitalization in 2019 he was discharged on Lamictal, Latuda and trazodone.  He denied current suicidal ideation.  Associated Signs/Symptoms: Depression Symptoms:  insomnia, psychomotor retardation, fatigue, feelings of worthlessness/guilt, difficulty concentrating, loss of energy/fatigue, disturbed sleep, (Hypo) Manic Symptoms:   Delusions, Distractibility, Hallucinations, Impulsivity, Irritable Mood, Labiality of Mood, Anxiety Symptoms:  Denied Psychotic Symptoms:  Delusions, Hallucinations: Visual PTSD Symptoms: Negative Total Time spent with patient: 45 minutes  Past Psychiatric History: Patient has had 3 psychiatric hospitalizations at this facility.  He also has one previous psychiatric hospitalization at [facility].  He has been deviously treated with Zyprexa, lithium, Cogentin, Depakote, Lamictal, Trileptal, Latuda, Saphris.  Is the patient at risk to self? Yes.    Has the patient been a risk to self in the past 6 months? Yes.    Has the patient been a risk to self within the distant past? No.  Is the patient a risk to others? No.  Has the patient been a risk to others in the past 6 months? No.  Has the patient been a risk to others within the distant past? No.   Prior Inpatient Therapy:   Prior Outpatient Therapy:    Alcohol Screening: Alcohol Brief Interventions/Follow-up: AUDIT Score <7 follow-up not indicated Substance Abuse History in the last 12 months:  Yes.   Consequences of Substance Abuse: Negative Previous Psychotropic Medications: Yes  Psychological Evaluations: Yes  Past Medical History:  Past Medical History:  Diagnosis Date  . Bipolar 1 disorder [facility])     Past Surgical History:  Procedure Laterality Date  . ADENOIDECTOMY    . CHOLECYSTECTOMY N/A 10/04/2015   Procedure: LAPAROSCOPIC CHOLECYSTECTOMY;  Surgeon: Axel Filler, MD;  Location: [facility] OR;  Service: General;  Laterality: N/A;  . CHOLECYSTECTOMY     Family History: History reviewed. No pertinent family history. Family Psychiatric  History: Father with bipolar disorder, multiple relatives with depression or anxiety. Tobacco Screening: Have you used any form of tobacco in the last 30 days? (Cigarettes, Smokeless Tobacco, Cigars,  and/or Pipes): No Social History:  Social History   Substance and Sexual Activity  Alcohol  Use Yes  . Alcohol/week: 3.0 standard drinks  . Types: 1 Shots of liquor, 2 Cans of beer per week   Comment: occasionally 1-2x/month     Social History   Substance and Sexual Activity  Drug Use Yes  . Types: Marijuana    Additional Social History:                           Allergies:  No Known Allergies Lab Results:  Results for orders placed or performed during the hospital encounter of 07/09/18 (from the past 48 hour(s))  Rapid urine drug screen (hospital performed)     Status: Abnormal   Collection Time: 07/10/18  1:51 AM  Result Value Ref Range   Opiates NONE DETECTED NONE DETECTED   Cocaine NONE DETECTED NONE DETECTED   Benzodiazepines NONE DETECTED NONE DETECTED   Amphetamines NONE DETECTED NONE DETECTED   Tetrahydrocannabinol POSITIVE (A) NONE DETECTED   Barbiturates NONE DETECTED NONE DETECTED    Comment: (NOTE) DRUG SCREEN FOR MEDICAL PURPOSES ONLY.  IF CONFIRMATION IS NEEDED FOR ANY PURPOSE, NOTIFY LAB WITHIN 5 DAYS. LOWEST DETECTABLE LIMITS FOR URINE DRUG SCREEN Drug Class                     Cutoff (ng/mL) Amphetamine and metabolites    1000 Barbiturate and metabolites    200 Benzodiazepine                 200 Tricyclics and metabolites     300 Opiates and metabolites        300 Cocaine and metabolites        300 THC                            50 Performed at [facility], [address] [place], [place] [address]     Blood Alcohol level:  Lab Results  Component Value Date   ETH <10 07/09/2018   ETH <10 07/04/2018    Metabolic Disorder Labs:  Lab Results  Component Value Date   HGBA1C 5.0 07/31/2017   MPG 96.8 07/31/2017   MPG 103 11/19/2016   Lab Results  Component Value Date   PROLACTIN 60.4 (H) 11/19/2016   Lab Results  Component Value Date   CHOL 149 07/31/2017   TRIG 116 07/31/2017   HDL 36 (L) 07/31/2017   CHOLHDL 4.1 07/31/2017   VLDL 23 07/31/2017   LDLCALC 90 07/31/2017   LDLCALC 52 11/19/2016     Current Medications: Current Facility-Administered Medications  Medication Dose Route Frequency Provider Last Rate Last Dose  . acetaminophen (TYLENOL) tablet 650 mg  650 mg Oral Q6H PRN Laveda AbbeParks, Laurie Britton, NP      . alum & mag hydroxide-simeth (MAALOX/MYLANTA) 200-200-20 MG/5ML suspension 30 mL  30 mL Oral Q4H PRN Laveda AbbeParks, Laurie Britton, NP      . divalproex (DEPAKOTE ER) 24 hr tablet 500 mg  500 mg Oral BID Laveda AbbeParks, Laurie Britton, NP      . hydrOXYzine (ATARAX/VISTARIL) tablet 25 mg  25 mg Oral TID PRN Laveda AbbeParks, Laurie Britton, NP      . magnesium hydroxide (MILK OF MAGNESIA) suspension 30 mL  30 mL Oral Daily PRN Laveda AbbeParks, Laurie Britton, NP      . Oxcarbazepine (TRILEPTAL) tablet 300 mg  300  mg Oral BID Laveda Abbe, NP      . risperiDONE (RISPERDAL) tablet 0.5 mg  0.5 mg Oral Daily Antonieta Pert, MD      . risperiDONE (RISPERDAL) tablet 1 mg  1 mg Oral QHS Antonieta Pert, MD      . traZODone (DESYREL) tablet 50 mg  50 mg Oral QHS PRN Laveda Abbe, NP       PTA Medications: Medications Prior to Admission  Medication Sig Dispense Refill Last Dose  . asenapine (SAPHRIS) 5 MG SUBL 24 hr tablet Place 1 tablet (5 mg total) under the tongue at bedtime. 30 tablet 0 unk  . Oxcarbazepine (TRILEPTAL) 300 MG tablet Take 1 tablet (300 mg total) by mouth 2 (two) times daily. 60 tablet 0 unk    Musculoskeletal: Strength & Muscle Tone: within normal limits Gait & Station: normal Patient leans: N/A  Psychiatric Specialty Exam: Physical Exam  Nursing note and vitals reviewed. Constitutional: He is oriented to person, place, and time. He appears well-developed and well-nourished.  HENT:  Head: Normocephalic and atraumatic.  Respiratory: Effort normal.  Neurological: He is alert and oriented to person, place, and time.    ROS  Blood pressure 139/89, pulse (!) 113, temperature 98 F (36.7 C), temperature source Oral, resp. rate 18, height 6\' 2"  (1.88 m), weight 117.5  kg, SpO2 100 %.Body mass index is 33.25 kg/m.  General Appearance: Casual  Eye Contact:  Fair  Speech:  Normal Rate  Volume:  Decreased  Mood:  Depressed and Dysphoric  Affect:  Congruent  Thought Process:  Coherent and Descriptions of Associations: Circumstantial  Orientation:  Full (Time, Place, and Person)  Thought Content:  Delusions and Hallucinations: Visual  Suicidal Thoughts:  No  Homicidal Thoughts:  No  Memory:  Immediate;   Fair Recent;   Fair Remote;   Fair  Judgement:  Impaired  Insight:  Lacking  Psychomotor Activity:  Decreased  Concentration:  Concentration: Fair and Attention Span: Fair  Recall:  [employer] of Knowledge:  Fair  Language:  Fair  Akathisia:  Negative  Handed:  Right  AIMS (if indicated):     Assets:  Communication Skills Desire for Improvement Financial Resources/Insurance Housing Leisure Time Physical Health Resilience Social Support  ADL's:  Intact  Cognition:  WNL  Sleep:       Treatment Plan Summary: Daily contact with patient to assess and evaluate symptoms and progress in treatment, Medication management and Plan : Patient is seen and examined.  Patient is a 38 year old male with a past psychiatric history significant for bipolar disorder, most recently mixed, severe, with psychotic features.  He will be admitted to the hospital.  He will be integrated into the milieu.  He will be encouraged to attend groups.  Apparently the Saphris was effective for him, but is too expensive and has not been on it for a while.  He currently is psychotic, having hyper religious thoughts and disorganization at times.  He also is reportedly having visual hallucinations.  Review of the electronic medical record revealed that he had been previously treated with Zyprexa, lithium, trazodone, Cogentin, Saphris, Depakote, Trileptal and Lamictal.  Because of the cost factor I will use Risperdal 0.5 mg daily, and 1 mg p.o. nightly.  We will titrate this during the  course the hospitalization.  If it does fail it appears as though he was successfully treated with Zyprexa, and we may switch to that depending upon his response.  Review of  his laboratories revealed a mild elevation in his AST at 43, urine drug screen positive for marijuana, but a blood alcohol that was negative.  His white blood cell count is slightly elevated at 10.6.  There was not a TSH present, but I will order that.  Observation Level/Precautions:  15 minute checks  Laboratory:  Chemistry Profile  Psychotherapy:    Medications:    Consultations:    Discharge Concerns:    Estimated LOS:  Other:     Physician Treatment Plan for Primary Diagnosis: <principal problem not specified> Long Term Goal(s): Improvement in symptoms so as ready for discharge  Short Term Goals: Ability to identify changes in lifestyle to reduce recurrence of condition will improve, Ability to verbalize feelings will improve, Ability to disclose and discuss suicidal ideas, Ability to demonstrate self-control will improve, Ability to identify and develop effective coping behaviors will improve, Ability to maintain clinical measurements within normal limits will improve, Compliance with prescribed medications will improve and Ability to identify triggers associated with substance abuse/mental health issues will improve  Physician Treatment Plan for Secondary Diagnosis: Active Problems:   Severe manic bipolar 1 disorder with psychotic behavior (HCC)  Long Term Goal(s): Improvement in symptoms so as ready for discharge  Short Term Goals: Ability to identify changes in lifestyle to reduce recurrence of condition will improve, Ability to verbalize feelings will improve, Ability to disclose and discuss suicidal ideas, Ability to demonstrate self-control will improve, Ability to identify and develop effective coping behaviors will improve, Ability to maintain clinical measurements within normal limits will improve, Compliance  with prescribed medications will improve and Ability to identify triggers associated with substance abuse/mental health issues will improve  I certify that inpatient services furnished can reasonably be expected to improve the patient's condition.    Antonieta Pert, MD 2/15/20201:17 PM

## 2018-07-11 NOTE — [facility] Suicide Risk Assessment (Signed)
[facility] Admission Suicide Risk Assessment   Nursing information obtained from:  Patient Demographic factors:  Male, Caucasian, Low socioeconomic status Current Mental Status:  NA Loss Factors:  Loss of significant relationship, Financial problems / change in socioeconomic status Historical Factors:  Prior suicide attempts, Impulsivity Risk Reduction Factors:  Positive therapeutic relationship, Responsible for children under 78 years of age, Religious beliefs about death  Total Time spent with patient: 30 minutes Principal Problem: <principal problem not specified> Diagnosis:  Active Problems:   Severe manic bipolar 1 disorder with psychotic behavior (HCC)  Subjective Data: Patient is seen and examined.  Patient is a 38 year old male with a past psychiatric history significant for bipolar disorder who presented to the [facility] emergency department on 07/10/2018 with psychotic symptoms.  The patient stated he was being admitted to the hospital because his wife and mother were "into conflict, and neither 1 of them believe me".  According to the notes in the emergency department the patient has been exhibiting bizarre and psychotic behavior.  He had been previously on Saphris and Trileptal, but apparently the Saphris was too expensive for him to afford and had not been on it in several months.  He stated he had last seen [facility] nurse [facility] "weeks and weeks" ago.  The patient admitted to poor sleep, and appears to have had visual hallucinations at home.  He also was found urinating in different containers and seeing snakes under his bed.  He is also in interview today significantly hyper religious.  He does report marijuana use, and realizes "it is against the law".  His last psychiatric hospitalization at our facility was on 07/30/2017.  He also had a hospitalization here on 11/15/2016.  On the hospitalization in 2019 he was discharged on Lamictal, Latuda and  trazodone.  He denied current suicidal ideation.  Continued Clinical Symptoms:    The "Alcohol Use Disorders Identification Test", Guidelines for Use in Primary Care, Second Edition.  World Science writer [facility]). Score between 0-7:  no or low risk or alcohol related problems. Score between 8-15:  moderate risk of alcohol related problems. Score between 16-19:  high risk of alcohol related problems. Score 20 or above:  warrants further diagnostic evaluation for alcohol dependence and treatment.   CLINICAL FACTORS:   Bipolar Disorder:   Mixed State   Musculoskeletal: Strength & Muscle Tone: within normal limits Gait & Station: normal Patient leans: N/A  Psychiatric Specialty Exam: Physical Exam  Nursing note and vitals reviewed. Constitutional: He is oriented to person, place, and time. He appears well-developed and well-nourished.  HENT:  Head: Normocephalic and atraumatic.  Respiratory: Effort normal.  Neurological: He is alert and oriented to person, place, and time.    ROS  Blood pressure 137/74, pulse (!) 103, temperature 98 F (36.7 C), temperature source Oral, resp. rate 18, height 6\' 2"  (1.88 m), weight 117.5 kg, SpO2 100 %.Body mass index is 33.25 kg/m.  General Appearance: Casual  Eye Contact:  Fair  Speech:  Normal Rate  Volume:  Decreased  Mood:  Dysphoric  Affect:  Congruent  Thought Process:  Coherent and Descriptions of Associations: Circumstantial  Orientation:  Full (Time, Place, and Person)  Thought Content:  Delusions and Hallucinations: Visual  Suicidal Thoughts:  No  Homicidal Thoughts:  No  Memory:  Immediate;   Poor Recent;   Poor Remote;   Poor  Judgement:  Impaired  Insight:  Lacking  Psychomotor Activity:  Decreased  Concentration:  Concentration: Fair and Attention Span: Fair  Recall:  [employer] of Knowledge:  Fair  Language:  Fair  Akathisia:  Negative  Handed:  Right  AIMS (if indicated):     Assets:  Desire for  Improvement Financial Resources/Insurance Housing Intimacy Physical Health Resilience Social Support  ADL's:  Intact  Cognition:  WNL  Sleep:         COGNITIVE FEATURES THAT CONTRIBUTE TO RISK:  None    SUICIDE RISK:   Mild:  Suicidal ideation of limited frequency, intensity, duration, and specificity.  There are no identifiable plans, no associated intent, mild dysphoria and related symptoms, good self-control (both objective and subjective assessment), few other risk factors, and identifiable protective factors, including available and accessible social support.  PLAN OF CARE: Patient is seen and examined.  Patient is a 38 year old male with a past psychiatric history significant for bipolar disorder, most recently mixed, severe, with psychotic features.  He will be admitted to the hospital.  He will be integrated into the milieu.  He will be encouraged to attend groups.  Apparently the Saphris was effective for him, but is too expensive and has not been on it for a while.  He currently is psychotic, having hyper religious thoughts and disorganization at times.  He also is reportedly having visual hallucinations.  Review of the electronic medical record revealed that he had been previously treated with Zyprexa, lithium, trazodone, Cogentin, Saphris, Depakote, Trileptal and Lamictal.  Because of the cost factor I will use Risperdal 0.5 mg daily, and 1 mg p.o. nightly.  We will titrate this during the course the hospitalization.  If it does fail it appears as though he was successfully treated with Zyprexa, and we may switch to that depending upon his response.  Review of his laboratories revealed a mild elevation in his AST at 43, urine drug screen positive for marijuana, but a blood alcohol that was negative.  His white blood cell count is slightly elevated at 10.6.  There was not a TSH present, but I will order that.  I certify that inpatient services furnished can reasonably be expected to  improve the patient's condition.   Antonieta Pert, MD 07/11/2018, 1:08 PM

## 2018-07-12 MED ORDER — RISPERIDONE 1 MG PO TBDP
1.0000 mg | ORAL_TABLET | Freq: Once | ORAL | Status: AC
  Administered 2018-07-12: 1 mg via ORAL
  Filled 2018-07-12: qty 1

## 2018-07-12 MED ORDER — TRAZODONE HCL 50 MG PO TABS
50.0000 mg | ORAL_TABLET | Freq: Every evening | ORAL | Status: DC | PRN
  Administered 2018-07-12 – 2018-07-13 (×2): 50 mg via ORAL
  Filled 2018-07-12 (×9): qty 1

## 2018-07-12 MED ORDER — RISPERIDONE 1 MG PO TABS
1.0000 mg | ORAL_TABLET | Freq: Every day | ORAL | Status: DC
  Administered 2018-07-13 – 2018-07-15 (×3): 1 mg via ORAL
  Filled 2018-07-12: qty 1
  Filled 2018-07-12: qty 7
  Filled 2018-07-12 (×3): qty 1

## 2018-07-12 MED ORDER — RISPERIDONE 2 MG PO TABS
2.0000 mg | ORAL_TABLET | Freq: Every day | ORAL | Status: DC
  Administered 2018-07-12: 2 mg via ORAL
  Filled 2018-07-12 (×2): qty 1

## 2018-07-12 MED ORDER — RISPERIDONE 1 MG PO TBDP
ORAL_TABLET | ORAL | Status: AC
  Filled 2018-07-12: qty 1

## 2018-07-12 NOTE — Progress Notes (Signed)
D:  Ronald Fritz was up and visible on the unit.  He is polite and cooperative.  He remains hyper religious and bizarre. He did report he has difficulty with sleeping and that he likes to pace if he cannot sleep.  He reported good visit with his mother this evening but Clinical research associate noted that he would argue with her in the day room.  He denied SI/HI or A/V hallucinations.  He took his hs medication without difficulty and prn given later for anxiety with minimal relief.  He has slept some but is currently pacing the floors in the hallway yawning.   A:  1:1 with RN for support and encouragement.  Medications as ordered.  Q 15 minute checks maintained for safety.  Encouraged participation in group and unit activities.   R:  Ronald Fritz remains safe on the unit.  We will continue to monitor the progress towards his goals.

## 2018-07-12 NOTE — [facility] Group Notes (Signed)
[facility] LCSW Group Therapy Note  Date/Time:  07/12/2018  11:00AM-12:00PM  Type of Therapy and Topic:  Group Therapy:  Music and Mood  Participation Level:  Minimal   Description of Group: In this process group, members listened to a variety of genres of music and identified that different types of music evoke different responses.  Patients were encouraged to identify music that was soothing for them and music that was energizing for them.  Patients discussed how this knowledge can help with wellness and recovery in various ways including managing depression and anxiety as well as encouraging healthy sleep habits.    Therapeutic Goals: 1. Patients will explore the impact of different varieties of music on mood 2. Patients will verbalize the thoughts they have when listening to different types of music 3. Patients will identify music that is soothing to them as well as music that is energizing to them 4. Patients will discuss how to use this knowledge to assist in maintaining wellness and recovery 5. Patients will explore the use of music as a coping skill  Summary of Patient Progress:  At the beginning of group, patient would not come into the room and expressed that he was "fine, thank you."  He did enter the room with about 10 minutes left in it, and apologized both the group participants and to CSW for not coming in earlier, saying that he is "easily frustrated."  He enjoyed the last 2 songs.  Therapeutic Modalities: Solution Focused Brief Therapy Activity   Ambrose Mantle, LCSW

## 2018-07-12 NOTE — Progress Notes (Signed)
[facility]  07/12/2018 12:43 PM Ronald BushDavid Fritz  MRN:  [id number] Subjective:  Patient is seen and examined. Patient is a 38 year old male with a past psychiatric history significant for bipolar disorder who [facility] 07/[facility]/2018 with psychotic symptoms. The patient stated he was being admitted to the hospital because his wife and mother were "into conflict, and neither 1 of them believe me". According to the notes in the emergency department the patient has been exhibiting bizarre and psychotic behavior.   Objective: Patient is seen and examined.  He is essentially unchanged from yesterday.  If anything he is more paranoid.  He would not walk into the office with me today.  He would talk with me through the doorway.  He continues to be hyper religious and paranoid.  He was started on Risperdal 0.5 mg daily and 1 mg p.o. nightly yesterday, but it appears to have had no effect on him.  His vital signs are stable, he is afebrile.  He only slept 2 hours last night.  Principal Problem: <principal problem not specified> Diagnosis: Active Problems:   Severe manic bipolar 1 disorder with psychotic behavior (HCC)  Total Time spent with patient: 15 minutes  Past Psychiatric History: See admission H&P  Past Medical History:  Past Medical History:  Diagnosis Date  . Bipolar 1 disorder [facility](HCC)     Past Surgical History:  Procedure Laterality Date  . ADENOIDECTOMY    . CHOLECYSTECTOMY N/A 10/04/2015   Procedure: LAPAROSCOPIC CHOLECYSTECTOMY;  Surgeon: Axel FillerArmando Ramirez, MD;  Location: [facility] OR;  Service: General;  Laterality: N/A;  . CHOLECYSTECTOMY     Family History: History reviewed. No pertinent family history. Family Psychiatric  History: See admission H&P Social History:  Social History   Substance and Sexual Activity  Alcohol Use Yes  . Alcohol/week: 3.0 standard drinks  . Types: 1 Shots of liquor, 2 Cans of beer per week   Comment: occasionally 1-2x/month     Social History   Substance and Sexual Activity  Drug Use Yes  . Types: Marijuana    Social History   Socioeconomic History  . Marital status: Legally Separated    Spouse name: Not on file  . Number of children: Not on file  . Years of education: Not on file  . Highest education level: Not on file  Occupational History  . Not on file  Social Needs  . Financial resource strain: Not on file  . Food insecurity:    Worry: Not on file    Inability: Not on file  . Transportation needs:    Medical: Not on file    Non-medical: Not on file  Tobacco Use  . Smoking status: Never Smoker  . Smokeless tobacco: Never Used  Substance and Sexual Activity  . Alcohol use: Yes    Alcohol/week: 3.0 standard drinks    Types: 1 Shots of liquor, 2 Cans of beer per week    Comment: occasionally 1-2x/month  . Drug use: Yes    Types: Marijuana  . Sexual activity: Not Currently    Birth control/protection: None  Lifestyle  . Physical activity:    Days per week: Not on file    Minutes per session: Not on file  . Stress: Not on file  Relationships  . Social connections:    Talks on phone: Not on file    Gets together: Not on file    Attends religious service: Not on file  Active member of club or organization: Not on file    Attends meetings of clubs or organizations: Not on file    Relationship status: Not on file  Other Topics Concern  . Not on file  Social History Narrative  . Not on file   Additional Social History:                         Sleep: Poor  Appetite:  Fair  Current Medications: Current Facility-Administered Medications  Medication Dose Route Frequency Provider Last Rate Last Dose  . acetaminophen (TYLENOL) tablet 650 mg  650 mg Oral Q6H PRN Laveda Abbe, NP      . alum & mag hydroxide-simeth (MAALOX/MYLANTA) 200-200-20 MG/5ML suspension 30 mL  30 mL Oral Q4H PRN Laveda Abbe, NP      . divalproex  (DEPAKOTE ER) 24 hr tablet 500 mg  500 mg Oral BID Laveda Abbe, NP   500 mg at 07/12/18 5597  . hydrOXYzine (ATARAX/VISTARIL) tablet 25 mg  25 mg Oral TID PRN Laveda Abbe, NP   25 mg at 07/12/18 0139  . magnesium hydroxide (MILK OF MAGNESIA) suspension 30 mL  30 mL Oral Daily PRN Laveda Abbe, NP      . Oxcarbazepine (TRILEPTAL) tablet 300 mg  300 mg Oral BID Laveda Abbe, NP   300 mg at 07/12/18 4163  . [START ON 07/13/2018] risperiDONE (RISPERDAL) tablet 1 mg  1 mg Oral Daily Antonieta Pert, MD      . risperiDONE (RISPERDAL) tablet 2 mg  2 mg Oral QHS Antonieta Pert, MD      . traZODone (DESYREL) tablet 50 mg  50 mg Oral QHS PRN Laveda Abbe, NP   50 mg at 07/11/18 2115    Lab Results: No results found for this or any previous visit (from the past 48 hour(s)).  Blood Alcohol level:  Lab Results  Component Value Date   ETH <10 07/09/2018   ETH <10 07/04/2018    Metabolic Disorder Labs: Lab Results  Component Value Date   HGBA1C 5.0 07/31/2017   MPG 96.8 07/31/2017   MPG 103 11/19/2016   Lab Results  Component Value Date   PROLACTIN 60.4 (H) 11/19/2016   Lab Results  Component Value Date   CHOL 149 07/31/2017   TRIG 116 07/31/2017   HDL 36 (L) 07/31/2017   CHOLHDL 4.1 07/31/2017   VLDL 23 07/31/2017   LDLCALC 90 07/31/2017   LDLCALC 52 11/19/2016    Physical Findings: AIMS: Facial and Oral Movements Muscles of Facial Expression: None, normal Lips and Perioral Area: None, normal Jaw: None, normal Tongue: None, normal,Extremity Movements Upper (arms, wrists, hands, fingers): None, normal Lower (legs, knees, ankles, toes): None, normal, Trunk Movements Neck, shoulders, hips: None, normal, Overall Severity Severity of abnormal movements (highest score from questions above): None, normal Incapacitation due to abnormal movements: None, normal Patient's awareness of abnormal movements (rate only patient's report): No  Awareness, Dental Status Current problems with teeth and/or dentures?: No Does patient usually wear dentures?: No  CIWA:  CIWA-Ar Total: 4 COWS:  COWS Total Score: 2  Musculoskeletal: Strength & Muscle Tone: within normal limits Gait & Station: normal Patient leans: N/A  Psychiatric Specialty Exam: Physical Exam  Nursing note and vitals reviewed. Constitutional: He appears well-developed and well-nourished.  HENT:  Head: Normocephalic and atraumatic.  Respiratory: Effort normal.  Neurological: He is alert.    ROS  Blood  pressure 106/85, pulse 87, temperature 98.4 F (36.9 C), temperature source Oral, resp. rate 16, height 6\' 2"  (1.88 m), weight 117.5 kg, SpO2 100 %.Body mass index is 33.25 kg/m.  General Appearance: Casual  Eye Contact:  Fair  Speech:  Normal Rate  Volume:  Decreased  Mood:  Dysphoric  Affect:  Constricted  Thought Process:  Goal Directed and Descriptions of Associations: Loose  Orientation:  Full (Time, Place, and Person)  Thought Content:  Delusions, Paranoid Ideation and Rumination  Suicidal Thoughts:  No  Homicidal Thoughts:  No  Memory:  Immediate;   Fair Recent;   Fair Remote;   Fair  Judgement:  Impaired  Insight:  Lacking  Psychomotor Activity:  Increased  Concentration:  Concentration: Fair and Attention Span: Fair  Recall:  [employer] of Knowledge:  Fair  Language:  Fair  Akathisia:  Negative  Handed:  Right  AIMS (if indicated):     Assets:  Communication Skills Desire for Improvement Financial Resources/Insurance Housing Physical Health Resilience  ADL's:  Intact  Cognition:  WNL  Sleep:  Number of Hours: 2     Treatment Plan Summary: Daily contact with patient to assess and evaluate symptoms and progress in treatment, Medication management and Plan : Patient is seen and examined.  Patient is a 38 year old male with the above-stated past psychiatric history who is seen in follow-up.  He is essentially unchanged from yesterday.   I am going to increase his Risperdal to 1 mg p.o. daily and 2 mg p.o. nightly.  We will give him 1 mg right now because of the seriousness of his symptoms.  We will continue the Trileptal 300 mg p.o. twice daily, and the Depakote ER 500 mg p.o. twice daily. 1.  Depakote ER 500 mg p.o. twice daily for mood stability. 2.  Trileptal 300 mg p.o. twice daily for mood stability. 3.  Increase Risperdal to 1 mg p.o. daily and 2 mg p.o. nightly for psychosis and mood stability. 4.  Continue trazodone 50 mg p.o. nightly as needed insomnia. 5.  Disposition planning-in progress.  Antonieta Pert, MD 07/12/2018, 12:43 PM

## 2018-07-12 NOTE — Plan of Care (Signed)
D: Patient presents manic, agitated, hyper-religious. He had an episode of agitation where he was yelling at staff, demanding to be discharged. "How would you feel if you were separated from your family?" He reports wanting to see his wife, who from our previous conversation intends to go through with divorce. He was pacing up and down the hall, chanting religious phrases most of the morning. He only slept 1.5 hr last night, despite receiving trazodone last night for sleep. He is medication compliant, and has no qualms with taking the prescribed meds. Patient denies SI/HI/AVH. He rates his depression, hopelessness and anxiety 0/10. His appetite is good, energy high and concentration good. He denies physical symptoms or withdrawal complaints.  A: Patient checked q15 min, and checks reviewed. Reviewed medication changes with patient and educated on side effects. Educated patient on importance of attending group therapy sessions and educated on several coping skills. Encouarged participation in milieu through recreation therapy and attending meals with peers. Support and encouragement provided. Fluids offered. R: Patient receptive to education on medications, and is medication compliant. Patient contracts for safety on the unit. Goal: work on "my relationship with God."

## 2018-07-12 NOTE — Progress Notes (Addendum)
Ronald Fritz has been up and pacing the floor since 215am.  He has been noted praying out loud and recently started repeating out loud "free all prisoners" and "I pray for help and reconciliation."  He however remains pleasant and cooperative.

## 2018-07-12 NOTE — Progress Notes (Signed)
Adult Psychoeducational Group Note  Date:  07/12/2018 Time:  9:33 PM  Group Topic/Focus:  Wrap-Up Group:   The focus of this group is to help patients review their daily goal of treatment and discuss progress on daily workbooks.  Participation Level:  Active  Participation Quality:  Appropriate  Affect:  Appropriate  Cognitive:  Appropriate  Insight: Appropriate  Engagement in Group:  Engaged  Modes of Intervention:  Discussion  Additional Comments: The patient expressed that he rates today a 9.The patient also said that hope is God .  Octavio Manns 07/12/2018, 9:33 PM

## 2018-07-13 DIAGNOSIS — F312 Bipolar disorder, current episode manic severe with psychotic features: Principal | ICD-10-CM

## 2018-07-13 MED ORDER — RISPERIDONE 2 MG PO TABS
4.0000 mg | ORAL_TABLET | Freq: Every day | ORAL | Status: DC
  Administered 2018-07-13 – 2018-07-14 (×2): 4 mg via ORAL
  Filled 2018-07-13: qty 2
  Filled 2018-07-13: qty 14
  Filled 2018-07-13 (×2): qty 2

## 2018-07-13 MED ORDER — CARBAMAZEPINE 200 MG PO TABS
200.0000 mg | ORAL_TABLET | Freq: Three times a day (TID) | ORAL | Status: DC
  Administered 2018-07-13 – 2018-07-15 (×7): 200 mg via ORAL
  Filled 2018-07-13: qty 21
  Filled 2018-07-13 (×4): qty 1
  Filled 2018-07-13 (×2): qty 21
  Filled 2018-07-13 (×7): qty 1

## 2018-07-13 NOTE — Progress Notes (Signed)
Recreation Therapy Notes  INPATIENT RECREATION THERAPY ASSESSMENT  Patient Details Name: Ronald Fritz MRN: [id number] DOB: [birth year] Today's Date: 07/13/2018       Information Obtained From: Patient  Able to Participate in Assessment/Interview: Yes  Patient Presentation: Alert  Reason for Admission (Per Patient): Other (Comments)(Family problems)  Patient Stressors: Family, Other (Comment)(Wife and mother are stressors; Finances)  Coping Skills:   Isolation, TV, Sports, Arguments, Music, Exercise, Prayer, Read, Other (Comment)(Facebook)  Leisure Interests (2+):  Individual - Journaling, Social - Friends, Social - Family, Technical brewer - Other (Comment)(Anything in nature)  Frequency of Recreation/Participation: Other (Comment)(Not often)  Awareness of Community Resources:  Yes  Community Resources:  Public affairs consultant, Other (Comment)([place])  Current Use: Yes  If no, Barriers?:    Expressed Interest in [employer] Information: No  [employer] of Residence:  Engineer, technical sales  Patient Main Form of Transportation: Set designer  Patient Strengths:  Discipline; Wisdom/Humility  Patient Identified Areas of Improvement:  Communication; Patience  Patient Goal for Hospitalization:  "patience"  Current SI (including self-harm):  No  Current HI:  No  Current AVH: No  Staff Intervention Plan: Group Attendance, Collaborate with Interdisciplinary Treatment Team  Consent to Intern Participation: N/A    Caroll Rancher, LRT/CTRS  Lillia Abed, [name] 07/13/2018, 1:21 PM

## 2018-07-13 NOTE — Progress Notes (Signed)
Recreation Therapy Notes  Date: 2.17.20 Time: 1000 Location:  500 [address] Dayroom  Group Topic: Wellness  Goal Area(s) Addresses:  Patient will define components of whole wellness. Patient will verbalize benefit of whole wellness.  Intervention:  Exercise  Activity: Exercise.  LRT led patients in a series of stretches to loosen them up.  Each patient was given the opportunity to lead the group in an exercise of their choice.  The group was to complete at least 30 minutes of exercise.  Patients were to take breaks and get water as needed.  Education: Wellness, Building control surveyor.   Education Outcome: Acknowledges education/In group clarification offered/Needs additional education.   Clinical Observations/Feedback: Pt did not attend group.    Caroll Rancher, LRT/CTRS         Caroll Rancher A 07/13/2018 11:37 AM

## 2018-07-13 NOTE — Progress Notes (Signed)
[facility] MD Progress Note  07/13/2018 11:13 AM Ronald Fritz  MRN:  [id number] Subjective:   Patient continues to minimize events leading to hospitalization and is very focused on past issues to include dispute with wife and/or mother law, that is still ill-defined, and past "emotional affair" even prior to his current relationships again he seems focused on irrelevant material but he denies hallucinations or thoughts of harming self or others no longer manic and just preoccupied with extraneous seeming material.  No EPS or TD.  Meds adjusted cognitive and reality based therapy, but still remains hyper religious on exam Principal Problem:  Diagnosis: Active Problems:   Severe manic bipolar 1 disorder with psychotic behavior (HCC)  Total Time spent with patient: 20 minutes  Past Medical History:  Past Medical History:  Diagnosis Date  . Bipolar 1 disorder [facility])     Past Surgical History:  Procedure Laterality Date  . ADENOIDECTOMY    . CHOLECYSTECTOMY N/A 10/04/2015   Procedure: LAPAROSCOPIC CHOLECYSTECTOMY;  Surgeon: Axel Filler, MD;  Location: [facility] OR;  Service: General;  Laterality: N/A;  . CHOLECYSTECTOMY     Family History: History reviewed. No pertinent family history. Social History:  Social History   Substance and Sexual Activity  Alcohol Use Yes  . Alcohol/week: 3.0 standard drinks  . Types: 1 Shots of liquor, 2 Cans of beer per week   Comment: occasionally 1-2x/month     Social History   Substance and Sexual Activity  Drug Use Yes  . Types: Marijuana    Social History   Socioeconomic History  . Marital status: Legally Separated    Spouse name: Not on file  . Number of children: Not on file  . Years of education: Not on file  . Highest education level: Not on file  Occupational History  . Not on file  Social Needs  . Financial resource strain: Not on file  . Food insecurity:    Worry: Not on file    Inability: Not on file  . Transportation needs:    Medical:  Not on file    Non-medical: Not on file  Tobacco Use  . Smoking status: Never Smoker  . Smokeless tobacco: Never Used  Substance and Sexual Activity  . Alcohol use: Yes    Alcohol/week: 3.0 standard drinks    Types: 1 Shots of liquor, 2 Cans of beer per week    Comment: occasionally 1-2x/month  . Drug use: Yes    Types: Marijuana  . Sexual activity: Not Currently    Birth control/protection: None  Lifestyle  . Physical activity:    Days per week: Not on file    Minutes per session: Not on file  . Stress: Not on file  Relationships  . Social connections:    Talks on phone: Not on file    Gets together: Not on file    Attends religious service: Not on file    Active member of club or organization: Not on file    Attends meetings of clubs or organizations: Not on file    Relationship status: Not on file  Other Topics Concern  . Not on file  Social History Narrative  . Not on file   Sleep: Good  Appetite:  Good  Current Medications: Current Facility-Administered Medications  Medication Dose Route Frequency Provider Last Rate Last Dose  . acetaminophen (TYLENOL) tablet 650 mg  650 mg Oral Q6H PRN Laveda Abbe, NP      . alum & mag hydroxide-simeth (MAALOX/MYLANTA)  200-200-20 MG/5ML suspension 30 mL  30 mL Oral Q4H PRN Laveda Abbe, NP      . divalproex (DEPAKOTE ER) 24 hr tablet 500 mg  500 mg Oral BID Laveda Abbe, NP   500 mg at 07/13/18 7510  . hydrOXYzine (ATARAX/VISTARIL) tablet 25 mg  25 mg Oral TID PRN Laveda Abbe, NP   25 mg at 07/12/18 0139  . magnesium hydroxide (MILK OF MAGNESIA) suspension 30 mL  30 mL Oral Daily PRN Laveda Abbe, NP      . Oxcarbazepine (TRILEPTAL) tablet 300 mg  300 mg Oral BID Laveda Abbe, NP   300 mg at 07/13/18 2585  . risperiDONE (RISPERDAL) tablet 1 mg  1 mg Oral Daily Antonieta Pert, MD   1 mg at 07/13/18 2778  . risperiDONE (RISPERDAL) tablet 2 mg  2 mg Oral QHS Antonieta Pert, MD   2 mg at 07/12/18 2110  . traZODone (DESYREL) tablet 50 mg  50 mg Oral QHS,MR X 1 Nira Conn A, NP   50 mg at 07/12/18 2110    Lab Results: No results found for this or any previous visit (from the past 48 hour(s)).  Blood Alcohol level:  Lab Results  Component Value Date   ETH <10 07/09/2018   ETH <10 07/04/2018    Metabolic Disorder Labs: Lab Results  Component Value Date   HGBA1C 5.0 07/31/2017   MPG 96.8 07/31/2017   MPG 103 11/19/2016   Lab Results  Component Value Date   PROLACTIN 60.4 (H) 11/19/2016   Lab Results  Component Value Date   CHOL 149 07/31/2017   TRIG 116 07/31/2017   HDL 36 (L) 07/31/2017   CHOLHDL 4.1 07/31/2017   VLDL 23 07/31/2017   LDLCALC 90 07/31/2017   LDLCALC 52 11/19/2016    Physical Findings: AIMS: Facial and Oral Movements Muscles of Facial Expression: None, normal Lips and Perioral Area: None, normal Jaw: None, normal Tongue: None, normal,Extremity Movements Upper (arms, wrists, hands, fingers): None, normal Lower (legs, knees, ankles, toes): None, normal, Trunk Movements Neck, shoulders, hips: None, normal, Overall Severity Severity of abnormal movements (highest score from questions above): None, normal Incapacitation due to abnormal movements: None, normal Patient's awareness of abnormal movements (rate only patient's report): No Awareness, Dental Status Current problems with teeth and/or dentures?: No Does patient usually wear dentures?: No  CIWA:  CIWA-Ar Total: 4 COWS:  COWS Total Score: 1  Musculoskeletal: Strength & Muscle Tone: within normal limits Gait & Station: normal Patient leans: N/A  Psychiatric Specialty Exam: Physical Exam  ROS  Blood pressure 119/75, pulse 92, temperature 97.9 F (36.6 C), temperature source Oral, resp. rate 16, height 6\' 2"  (1.88 m), weight 117.5 kg, SpO2 100 %.Body mass index is 33.25 kg/m.  General Appearance: Casual  Eye Contact:  Good  Speech:  Clear and Coherent   Volume:  Normal  Mood:  Euthymic  Affect:  Restricted  Thought Process:  Irrelevant  Orientation:  Full (Time, Place, and Person)  Thought Content:  Tangential  Suicidal Thoughts:  No  Homicidal Thoughts:  No  Memory:  Immediate;   Fair  Judgement:  Fair  Insight:  Shallow  Psychomotor Activity:  Normal  Concentration:  Concentration: Good  Recall:  Good  Fund of Knowledge:  Good  Language:  Good  Akathisia:  Negative  Handed:  Right  AIMS (if indicated):     Assets:  Physical Health Resilience  ADL's:  Intact  Cognition:  WNL  Sleep:  Number of Hours: 6.5     Treatment Plan Summary: Daily contact with patient to assess and evaluate symptoms and progress in treatment, Medication management and Plan To new current measures escalate antipsychotic therapy continue reality-based therapy try to contact wife with his permission  Malvin JohnsFARAH,Kynnedy Carreno, MD 07/13/2018, 11:13 AM

## 2018-07-13 NOTE — Plan of Care (Signed)
  Problem: Education: Goal: Verbalization of understanding the information provided will improve Outcome: Progressing   Problem: Activity: Goal: Sleeping patterns will improve Outcome: Not Progressing

## 2018-07-13 NOTE — Progress Notes (Signed)
D: Pt denies SI/HI/AVH. Pt is pleasant and cooperative. Pt stated he was feeling better due to being calm, focused and clear. Pt stated he wanted to see his kids.  A: Pt was offered support and encouragement. Pt was given scheduled medications. Pt was encourage to attend groups. Q 15 minute checks were done for safety.  R:Pt attends groups and interacts well with peers and staff. Pt is taking medication. Pt has no complaints.Pt receptive to treatment and safety maintained on unit.  Problem: Education: Goal: Emotional status will improve Outcome: Progressing   Problem: Activity: Goal: Interest or engagement in activities will improve Outcome: Progressing   Problem: Activity: Goal: Sleeping patterns will improve Outcome: Progressing

## 2018-07-13 NOTE — Progress Notes (Signed)
Adult Psychoeducational Group Note  Date:  07/13/2018 Time:  9:14 PM  Group Topic/Focus:  Wrap-Up Group:   The focus of this group is to help patients review their daily goal of treatment and discuss progress on daily workbooks.  Participation Level:  Active  Participation Quality:  Appropriate  Affect:  Appropriate  Cognitive:  Appropriate  Insight: Appropriate  Engagement in Group:  Engaged  Modes of Intervention:  Discussion  Additional Comments: The patient expressed that he attended obstacles  group.The patient also said that he  Rates today a 10. Octavio Manns 07/13/2018, 9:14 PM

## 2018-07-13 NOTE — [facility] Group Notes (Signed)
[facility] LCSW Group Therapy Note  Date/Time: 07/13/18, 1315  Type of Therapy and Topic:  Group Therapy:  Overcoming Obstacles  Participation Level:  active  Description of Group:    In this group patients will be encouraged to explore what they see as obstacles to their own wellness and recovery. They will be guided to discuss their thoughts, feelings, and behaviors related to these obstacles. The group will process together ways to cope with barriers, with attention given to specific choices patients can make. Each patient will be challenged to identify changes they are motivated to make in order to overcome their obstacles. This group will be process-oriented, with patients participating in exploration of their own experiences as well as giving and receiving support and challenge from other group members.  Therapeutic Goals: 1. Patient will identify personal and current obstacles as they relate to admission. 2. Patient will identify barriers that currently interfere with their wellness or overcoming obstacles.  3. Patient will identify feelings, thought process and behaviors related to these barriers. 4. Patient will identify two changes they are willing to make to overcome these obstacles:    Summary of Patient Progress: pt presents as very together and was active and engaged throughout group.  Pt interacted with other group members and asked to pertinent questions.  Pt identified his obstacles as "conflict between my wife and mother" and money.  Later in group, pt made reference to conflict between he and his wife and their movement towards divorce.  No bizarre behavior or delusional thoughts evident today.       Therapeutic Modalities:   Cognitive Behavioral Therapy Solution Focused Therapy Motivational Interviewing Relapse Prevention Therapy  Daleen Squibb, LCSW

## 2018-07-13 NOTE — Progress Notes (Signed)
D:  Ronald Fritz was in the day room much of the evening.  He was pleasant and cooperative on initial approach.  He attended evening wrap up group.  He reported he had a good day and that "it is what it is."  He denied SI/HI or A/V hallucinations.  He was less restless this evening and was not noted pacing the floors.  He continued to report difficulty sleeping and staffed with Ronald Conn NP for repeat of trazodone if needed.  He took his hs medications without difficulty.  He is currently resting with his eyes closed and appears to be asleep.   A:  1:1 with RN for support and encouragement.  Medications as ordered.  Q 15 minute checks maintained for safety.  Encouraged participation in group and unit activities.   R:  Ronald Fritz remains safe on the unit.  We will continue to monitor the progress towards his goals.

## 2018-07-13 NOTE — Plan of Care (Signed)
Problem: Activity: Goal: Interest or engagement in activities will improve Outcome: Progressing   Problem: Safety: Goal: Periods of time without injury will increase Outcome: Progressing   Problem: Coping: Goal: Coping ability will improve Outcome: Progressing   Problem: Nutritional: Goal: Ability to achieve adequate nutritional intake will improve Outcome: Progressing DAR Note: Pt continues to be religiously preoccupied with tangential speech. Observed preaching to another peer about women from the bible. Denies SI, HI, AVH and pain "no ma'am, I just want to go home to my wife and children, I trust the doctor, I will take the medicines but I just want to go home to my family". Rates his depression, anxiety and hopelessness all 0/10. Reports he slept well last night with good appetite, high energy and good concentration level. Pt's goal for today is "my relationship with God, love my neighbors as myself". Pt took his medications without issues. Denies side effects.  Emotional support and reassurance provided to pt throughout this shift. Scheduled medications offered with verbal education and effects monitored. Encouraged pt to voice concerns, attend to ADLs and comply with current treatment regimen including groups. Q 15 minutes safety checks continues without self harm gestures or outburst to note thus far.  Pt receptive to care, cooperative with unit routines. Tolerates all PO intake well. POC continues for safety and mood stability.

## 2018-07-13 NOTE — Tx Team (Signed)
Interdisciplinary Treatment and Diagnostic Plan Update  07/13/2018 Time of Session: 0904 Ronald Fritz MRN: [id number]  Principal Diagnosis: <principal problem not specified>  Secondary Diagnoses: Active Problems:   Severe manic bipolar 1 disorder with psychotic behavior (HCC)   Current Medications:  Current Facility-Administered Medications  Medication Dose Route Frequency Provider Last Rate Last Dose  . acetaminophen (TYLENOL) tablet 650 mg  650 mg Oral Q6H PRN Laveda Abbe, NP      . alum & mag hydroxide-simeth (MAALOX/MYLANTA) 200-200-20 MG/5ML suspension 30 mL  30 mL Oral Q4H PRN Laveda Abbe, NP      . carbamazepine (TEGRETOL) tablet 200 mg  200 mg Oral TID Malvin Johns, MD   200 mg at 07/13/18 1154  . divalproex (DEPAKOTE ER) 24 hr tablet 500 mg  500 mg Oral BID Laveda Abbe, NP   500 mg at 07/13/18 4268  . hydrOXYzine (ATARAX/VISTARIL) tablet 25 mg  25 mg Oral TID PRN Laveda Abbe, NP   25 mg at 07/12/18 0139  . magnesium hydroxide (MILK OF MAGNESIA) suspension 30 mL  30 mL Oral Daily PRN Laveda Abbe, NP      . risperiDONE (RISPERDAL) tablet 1 mg  1 mg Oral Daily Antonieta Pert, MD   1 mg at 07/13/18 3419  . risperiDONE (RISPERDAL) tablet 4 mg  4 mg Oral QHS Malvin Johns, MD      . traZODone (DESYREL) tablet 50 mg  50 mg Oral QHS,MR X 1 Nira Conn A, NP   50 mg at 07/12/18 2110   PTA Medications: Medications Prior to Admission  Medication Sig Dispense Refill Last Dose  . asenapine (SAPHRIS) 5 MG SUBL 24 hr tablet Place 1 tablet (5 mg total) under the tongue at bedtime. 30 tablet 0 unk  . Oxcarbazepine (TRILEPTAL) 300 MG tablet Take 1 tablet (300 mg total) by mouth 2 (two) times daily. 60 tablet 0 unk    Patient Stressors: Financial difficulties Marital or family conflict  Patient Strengths: Ability for insight Capable of independent living General fund of knowledge  Treatment Modalities: Medication Management, Group  therapy, Case management,  1 to 1 session with clinician, Psychoeducation, Recreational therapy.   Physician Treatment Plan for Primary Diagnosis: <principal problem not specified> Long Term Goal(s): Improvement in symptoms so as ready for discharge Improvement in symptoms so as ready for discharge   Short Term Goals: Ability to identify changes in lifestyle to reduce recurrence of condition will improve Ability to verbalize feelings will improve Ability to disclose and discuss suicidal ideas Ability to demonstrate self-control will improve Ability to identify and develop effective coping behaviors will improve Ability to maintain clinical measurements within normal limits will improve Compliance with prescribed medications will improve Ability to identify triggers associated with substance abuse/mental health issues will improve Ability to identify changes in lifestyle to reduce recurrence of condition will improve Ability to verbalize feelings will improve Ability to disclose and discuss suicidal ideas Ability to demonstrate self-control will improve Ability to identify and develop effective coping behaviors will improve Ability to maintain clinical measurements within normal limits will improve Compliance with prescribed medications will improve Ability to identify triggers associated with substance abuse/mental health issues will improve  Medication Management: Evaluate patient's response, side effects, and tolerance of medication regimen.  Therapeutic Interventions: 1 to 1 sessions, Unit Group sessions and Medication administration.  Evaluation of Outcomes: Progressing  Physician Treatment Plan for Secondary Diagnosis: Active Problems:   Severe manic bipolar 1 disorder with psychotic  behavior (HCC)  Long Term Goal(s): Improvement in symptoms so as ready for discharge Improvement in symptoms so as ready for discharge   Short Term Goals: Ability to identify changes in  lifestyle to reduce recurrence of condition will improve Ability to verbalize feelings will improve Ability to disclose and discuss suicidal ideas Ability to demonstrate self-control will improve Ability to identify and develop effective coping behaviors will improve Ability to maintain clinical measurements within normal limits will improve Compliance with prescribed medications will improve Ability to identify triggers associated with substance abuse/mental health issues will improve Ability to identify changes in lifestyle to reduce recurrence of condition will improve Ability to verbalize feelings will improve Ability to disclose and discuss suicidal ideas Ability to demonstrate self-control will improve Ability to identify and develop effective coping behaviors will improve Ability to maintain clinical measurements within normal limits will improve Compliance with prescribed medications will improve Ability to identify triggers associated with substance abuse/mental health issues will improve     Medication Management: Evaluate patient's response, side effects, and tolerance of medication regimen.  Therapeutic Interventions: 1 to 1 sessions, Unit Group sessions and Medication administration.  Evaluation of Outcomes: Progressing   RN Treatment Plan for Primary Diagnosis: <principal problem not specified> Long Term Goal(s): Knowledge of disease and therapeutic regimen to maintain health will improve  Short Term Goals: Ability to identify and develop effective coping behaviors will improve and Compliance with prescribed medications will improve  Medication Management: RN will administer medications as ordered by provider, will assess and evaluate patient's response and provide education to patient for prescribed medication. RN will report any adverse and/or side effects to prescribing provider.  Therapeutic Interventions: 1 on 1 counseling sessions, Psychoeducation, Medication  administration, Evaluate responses to treatment, Monitor vital signs and CBGs as ordered, Perform/monitor CIWA, COWS, AIMS and Fall Risk screenings as ordered, Perform wound care treatments as ordered.  Evaluation of Outcomes: Progressing   LCSW Treatment Plan for Primary Diagnosis: <principal problem not specified> Long Term Goal(s): Safe transition to appropriate next level of care at discharge, Engage patient in therapeutic group addressing interpersonal concerns.  Short Term Goals: Engage patient in aftercare planning with referrals and resources, Increase social support and Increase skills for wellness and recovery  Therapeutic Interventions: Assess for all discharge needs, 1 to 1 time with Social worker, Explore available resources and support systems, Assess for adequacy in community support network, Educate family and significant other(s) on suicide prevention, Complete Psychosocial Assessment, Interpersonal group therapy.  Evaluation of Outcomes: Progressing   Progress in Treatment: Attending groups: Yes. Participating in groups: Yes. Taking medication as prescribed: Yes. Toleration medication: Yes. Family/Significant other contact made: No, will contact:  mother Patient understands diagnosis: Yes. Discussing patient identified problems/goals with staff: Yes. Medical problems stabilized or resolved: Yes. Denies suicidal/homicidal ideation: Yes. Issues/concerns per patient self-inventory: No. Other: none  New problem(s) identified: No, Describe:  none  New Short Term/Long Term Goal(s):  Patient Goals:  "be free"  Discharge Plan or Barriers:   Reason for Continuation of Hospitalization: Delusions  Medication stabilization  Estimated Length of Stay:3-5 days  Attendees: Patient:Ronald Fritz 07/13/2018   Physician: Dr Jeannine Kitten, MD 07/13/2018   Nursing: Sherryl Manges, RN 07/13/2018   RN Care Manager: 07/13/2018   Social Worker: Daleen Squibb, LCSW 07/13/2018  Recreational  Therapist:  07/13/2018   Other:  07/13/2018   Other:  07/13/2018   Other: 07/13/2018        Scribe for Treatment Team: Lorri Frederick, LCSW 07/13/2018  3:30 PM

## 2018-07-14 MED ORDER — TRAZODONE HCL 100 MG PO TABS
100.0000 mg | ORAL_TABLET | Freq: Every evening | ORAL | Status: DC | PRN
  Administered 2018-07-14: 100 mg via ORAL
  Filled 2018-07-14 (×7): qty 1

## 2018-07-14 MED ORDER — ARIPIPRAZOLE 2 MG PO TABS
2.0000 mg | ORAL_TABLET | Freq: Once | ORAL | Status: AC
  Administered 2018-07-14: 2 mg via ORAL
  Filled 2018-07-14 (×2): qty 1

## 2018-07-14 NOTE — Plan of Care (Signed)
  Problem: Coping: Goal: Ability to verbalize frustrations and anger appropriately will improve Outcome: Progressing Goal: Ability to demonstrate self-control will improve Outcome: Progressing   D: Pt alert and oriented on the unit. Pt was pacing in the hallway while reading his bible. Pt engaging with RN staff and other pts. Pt denies SI/HI, A/VH. Pt rated his depression a 0, a depression a 0, and feelings of hopelessness a 0, with 10 being the worst. Pt's goal for today is "My relationship with God." Pt is cooperative on the unit and attended groups today.  A: Education, support and encouragement provided, q15 minute safety checks remain in effect. Medications administered per MD orders.  R: No reactions/side effects to medicine noted. Pt denies any concerns at this time, and verbally contracts for safety. Pt ambulating on the unit with no issues. Pt remains safe on and off the unit.

## 2018-07-14 NOTE — Progress Notes (Signed)
[facility] MD Progress Note  07/14/2018 10:36 AM Ronald Fritz  MRN:  [id number] Subjective:   Patient somewhat regressed with regard to psychosis but denies specific auditory and visual hallucinations, just needs more redirection today understands he would not be staying with his wife will be staying in a hotel or with his mother.  He is currently alert and oriented to person place situation he denies thoughts of harming self or others does agree to long-acting injectable will get tested dose of aripiprazole today, no EPS or TD, again denies acute hallucinations but seems more needy and guarded today indicating possible regression/no suicidal thoughts no homicidal thoughts  Principal Problem: Bipolar exacerbation Diagnosis: Active Problems:   Severe manic bipolar 1 disorder with psychotic behavior (HCC)  Total Time spent with patient: 20 minutes  Past Medical History:  Past Medical History:  Diagnosis Date  . Bipolar 1 disorder [facility])     Past Surgical History:  Procedure Laterality Date  . ADENOIDECTOMY    . CHOLECYSTECTOMY N/A 10/04/2015   Procedure: LAPAROSCOPIC CHOLECYSTECTOMY;  Surgeon: Axel Filler, MD;  Location: [facility] OR;  Service: General;  Laterality: N/A;  . CHOLECYSTECTOMY     Family History: History reviewed. No pertinent family history.  Social History:  Social History   Substance and Sexual Activity  Alcohol Use Yes  . Alcohol/week: 3.0 standard drinks  . Types: 1 Shots of liquor, 2 Cans of beer per week   Comment: occasionally 1-2x/month     Social History   Substance and Sexual Activity  Drug Use Yes  . Types: Marijuana    Social History   Socioeconomic History  . Marital status: Legally Separated    Spouse name: Not on file  . Number of children: Not on file  . Years of education: Not on file  . Highest education level: Not on file  Occupational History  . Not on file  Social Needs  . Financial resource strain: Not on file  . Food insecurity:    Worry:  Not on file    Inability: Not on file  . Transportation needs:    Medical: Not on file    Non-medical: Not on file  Tobacco Use  . Smoking status: Never Smoker  . Smokeless tobacco: Never Used  Substance and Sexual Activity  . Alcohol use: Yes    Alcohol/week: 3.0 standard drinks    Types: 1 Shots of liquor, 2 Cans of beer per week    Comment: occasionally 1-2x/month  . Drug use: Yes    Types: Marijuana  . Sexual activity: Not Currently    Birth control/protection: None  Lifestyle  . Physical activity:    Days per week: Not on file    Minutes per session: Not on file  . Stress: Not on file  Relationships  . Social connections:    Talks on phone: Not on file    Gets together: Not on file    Attends religious service: Not on file    Active member of club or organization: Not on file    Attends meetings of clubs or organizations: Not on file    Relationship status: Not on file  Other Topics Concern  . Not on file  Social History Narrative  . Not on file   Additional Social History:                         Sleep: Fair  Appetite:  Fair  Current Medications: Current Facility-Administered Medications  Medication Dose Route Frequency Provider Last Rate Last Dose  . acetaminophen (TYLENOL) tablet 650 mg  650 mg Oral Q6H PRN Laveda AbbeParks, Laurie Britton, NP      . alum & mag hydroxide-simeth (MAALOX/MYLANTA) 200-200-20 MG/5ML suspension 30 mL  30 mL Oral Q4H PRN Laveda AbbeParks, Laurie Britton, NP      . ARIPiprazole (ABILIFY) tablet 2 mg  2 mg Oral Once Malvin JohnsFarah, Chayna Surratt, MD      . carbamazepine (TEGRETOL) tablet 200 mg  200 mg Oral TID Malvin JohnsFarah, Altagracia Rone, MD   200 mg at 07/14/18 [id number]  . divalproex (DEPAKOTE ER) 24 hr tablet 500 mg  500 mg Oral BID Laveda AbbeParks, Laurie Britton, NP   500 mg at 07/14/18 [id number]  . hydrOXYzine (ATARAX/VISTARIL) tablet 25 mg  25 mg Oral TID PRN Laveda AbbeParks, Laurie Britton, NP   25 mg at 07/12/18 0139  . magnesium hydroxide (MILK OF MAGNESIA) suspension 30 mL  30 mL Oral Daily  PRN Laveda AbbeParks, Laurie Britton, NP      . risperiDONE (RISPERDAL) tablet 1 mg  1 mg Oral Daily Antonieta Pertlary, Greg Lawson, MD   1 mg at 07/14/18 0729  . risperiDONE (RISPERDAL) tablet 4 mg  4 mg Oral QHS Malvin JohnsFarah, Darrly Loberg, MD   4 mg at 07/13/18 2121  . traZODone (DESYREL) tablet 50 mg  50 mg Oral QHS,MR X 1 Nira ConnBerry, Jason A, NP   50 mg at 07/13/18 2120    Lab Results: No results found for this or any previous visit (from the past 48 hour(s)).  Blood Alcohol level:  Lab Results  Component Value Date   ETH <10 07/09/2018   ETH <10 07/04/2018    Metabolic Disorder Labs: Lab Results  Component Value Date   HGBA1C 5.0 07/31/2017   MPG 96.8 07/31/2017   MPG 103 11/19/2016   Lab Results  Component Value Date   PROLACTIN 60.4 (H) 11/19/2016   Lab Results  Component Value Date   CHOL 149 07/31/2017   TRIG 116 07/31/2017   HDL 36 (L) 07/31/2017   CHOLHDL 4.1 07/31/2017   VLDL 23 07/31/2017   LDLCALC 90 07/31/2017   LDLCALC 52 11/19/2016    Physical Findings: AIMS: Facial and Oral Movements Muscles of Facial Expression: None, normal Lips and Perioral Area: None, normal Jaw: None, normal Tongue: None, normal,Extremity Movements Upper (arms, wrists, hands, fingers): None, normal Lower (legs, knees, ankles, toes): None, normal, Trunk Movements Neck, shoulders, hips: None, normal, Overall Severity Severity of abnormal movements (highest score from questions above): None, normal Incapacitation due to abnormal movements: None, normal Patient's awareness of abnormal movements (rate only patient's report): No Awareness, Dental Status Current problems with teeth and/or dentures?: No Does patient usually wear dentures?: No  CIWA:  CIWA-Ar Total: 4 COWS:  COWS Total Score: 1  Musculoskeletal: Strength & Muscle Tone: within normal limits Gait & Station: normal Patient leans: N/A  Psychiatric Specialty Exam: Physical Exam  ROS  Blood pressure 102/76, pulse 98, temperature 97.6 F (36.4 C),  temperature source Oral, resp. rate 16, height 6\' 2"  (1.88 m), weight 117.5 kg, SpO2 100 %.Body mass index is 33.25 kg/m.  General Appearance: Casual  Eye Contact:  Good  Speech:  Clear and Coherent  Volume:  Normal but monotone and stiff  Mood:  Euthymic  Affect:  Congruent and Constricted  Thought Process:  Goal Directed  Orientation:  Full (Time, Place, and Person)  Thought Content:  Tangential  Suicidal Thoughts:  No  Homicidal Thoughts:  No  Memory:  Immediate;  Fair  Judgement:  Fair  Insight:  Good  Psychomotor Activity:  Negative  Concentration:  Concentration: Fair  Recall:  Fair  Fund of Knowledge:  Good  Language:  Good  Akathisia:  Negative  Handed:  Right  AIMS (if indicated):     Assets:  Physical Health  ADL's:  Intact  Cognition:  WNL  Sleep:  Number of Hours: 5.5     Treatment Plan Summary: Daily contact with patient to assess and evaluate symptoms and progress in treatment, Medication management and Plan Continue current antipsychotic therapy and reality based therapy we will try long-acting injectable starting tomorrow after test dose today will discharge tomorrow  Malvin Johns, MD 07/14/2018, 10:36 AM

## 2018-07-14 NOTE — Progress Notes (Signed)
Recreation Therapy Notes  Date: 2.18.20 Time: 1000 Location: 500 Hall Dayroom  Group Topic: Communication  Goal Area(s) Addresses:  Patient will effectively communicate with peers in group.  Patient will verbalize benefit of healthy communication. Patient will verbalize positive effect of healthy communication on post d/c goals.  Patient will identify communication techniques that made activity effective for group.   Intervention: Geometrical drawings, blank paper, pencils  Activity: Back to Back Drawings.  Patients were given the opportunity to describe Fritz geometrical drawing to their peers.  The patient that was giving the instructions were to give the direction of the shapes, whether they were to be shaded in and the type of shape.  The patients doing the drawing could not ask any clarifying questions, they could only ask the person giving the instructions to repeat themselves.  When completed, they would match up pictures to see how accurate the drawings were to the original.  Education: Communication, Discharge Planning  Education Outcome: Acknowledges understanding/In group clarification offered/Needs additional education.   Clinical Observations/Feedback: Pt did not attend group.   Ronald Fritz, LRT/CTRS         Ronald Fritz 07/14/2018 12:36 PM 

## 2018-07-14 NOTE — Progress Notes (Signed)
D: Pt denies SI/HI/AVH. Pt is pleasant and cooperative. Pt visible on the unit this evening, pt stated he was feeling better.  A: Pt was offered support and encouragement. Pt was given scheduled medications. Pt was encourage to attend groups. Q 15 minute checks were done for safety.  R:Pt attends groups and interacts well with peers and staff. Pt is taking medication. Pt has no complaints.Pt receptive to treatment and safety maintained on unit.  Problem: Education: Goal: Emotional status will improve Outcome: Progressing   Problem: Education: Goal: Mental status will improve Outcome: Progressing   Problem: Education: Goal: Verbalization of understanding the information provided will improve Outcome: Progressing   Problem: Activity: Goal: Interest or engagement in activities will improve Outcome: Progressing   Problem: Activity: Goal: Sleeping patterns will improve Outcome: Progressing

## 2018-07-14 NOTE — [facility] Group Notes (Signed)
LCSW Group Therapy Note  07/14/2018 3:42 PM  Type of Therapy/Topic: Group Therapy: Feelings about Diagnosis  Participation Level: Active   Description of Group:  This group will allow patients to explore their thoughts and feelings about diagnoses they have received. Patients will be guided to explore their level of understanding and acceptance of these diagnoses. Facilitator will encourage patients to process their thoughts and feelings about the reactions of others to their diagnosis and will guide patients in identifying ways to discuss their diagnosis with significant others in their lives. This group will be process-oriented, with patients participating in exploration of their own experiences, giving and receiving support, and processing challenge from other group members.  Therapeutic Goals: 1. Patient will demonstrate understanding of diagnosis as evidenced by identifying two or more symptoms of the disorder 2. Patient will be able to express two feelings regarding the diagnosis 3. Patient will demonstrate their ability to communicate their needs through discussion and/or role play  Summary of Patient Progress: Patient was an active participant in the group discussion and offered support and encouragement to other participants. He states having a diagnosis is a "liberating" experience, as it helps him identify medications and treatment approaches.    Therapeutic Modalities:  Cognitive Behavioral Therapy Brief Therapy Feelings Identification   Enid Cutter, MSW, [facility] Clinical Social Worker

## 2018-07-15 MED ORDER — CARBAMAZEPINE 200 MG PO TABS: 200.0000 mg | ORAL_TABLET | Freq: Three times a day (TID) | ORAL | 0 refills | Status: AC

## 2018-07-15 MED ORDER — HYDROXYZINE HCL 25 MG PO TABS: 25.0000 mg | ORAL_TABLET | Freq: Three times a day (TID) | ORAL | 0 refills | Status: AC | PRN

## 2018-07-15 MED ORDER — ARIPIPRAZOLE ER 400 MG IM SRER: 400.0000 mg | INTRAMUSCULAR | 0 refills | Status: AC

## 2018-07-15 MED ORDER — RISPERIDONE 4 MG PO TABS: 4.0000 mg | ORAL_TABLET | Freq: Every day | ORAL | 0 refills | Status: AC

## 2018-07-15 MED ORDER — ARIPIPRAZOLE ER 400 MG IM SRER: 400.0000 mg | INTRAMUSCULAR | 0 refills | Status: DC

## 2018-07-15 MED ORDER — ARIPIPRAZOLE ER 400 MG IM SRER
400.0000 mg | INTRAMUSCULAR | Status: DC
  Administered 2018-07-15: 400 mg via INTRAMUSCULAR

## 2018-07-15 MED ORDER — TRAZODONE HCL 100 MG PO TABS
100.0000 mg | ORAL_TABLET | Freq: Every evening | ORAL | Status: DC | PRN
  Filled 2018-07-15: qty 7

## 2018-07-15 MED ORDER — RISPERIDONE 1 MG PO TABS: 1.0000 mg | ORAL_TABLET | Freq: Every day | ORAL | 0 refills | Status: AC

## 2018-07-15 MED ORDER — DIVALPROEX SODIUM ER 500 MG PO TB24: 500.0000 mg | ORAL_TABLET | Freq: Two times a day (BID) | ORAL | 0 refills | Status: AC

## 2018-07-15 MED ORDER — TRAZODONE HCL 100 MG PO TABS: 100.0000 mg | ORAL_TABLET | Freq: Every evening | ORAL | 0 refills | Status: AC | PRN

## 2018-07-15 NOTE — Discharge Summary (Signed)
Physician Discharge Summary Note  Patient:  Ronald Fritz is an 38 y.o., male  MRN:  [id number]  DOB:  18-Mar-1981  Patient phone:  [phone] (home)   Patient address:   [address] [place] [address],   Total Time spent with patient: Greater than 30 minutes.  Date of Admission:  07/11/2018  Date of Discharge: 07-15-18  Reason for Admission: Worsening psychosis  Principal Problem: Severe manic bipolar 1 disorder with psychotic behavior [facility])  Discharge Diagnoses: Patient Active Problem List   Diagnosis Date Noted  . Severe manic bipolar 1 disorder with psychotic behavior (HCC) [F31.2] 07/11/2018    Priority: High  . Bipolar 1 disorder, mixed, moderate (HCC) [F31.62] 07/10/2018  . Acute psychosis (HCC) [F23]   . Bipolar affective disorder, currently manic, mild (HCC) [F31.11] 07/05/2018  . Bipolar 1 disorder, depressed, severe (HCC) [F31.4] 07/30/2017  . 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 Acute cholecystitis [K81.0] 10/03/2015   Past Psychiatric History: Bipolar affective disorder with psychosis.  Past Medical History:  Past Medical History:  Diagnosis Date  . Bipolar 1 disorder [facility])     Past Surgical History:  Procedure Laterality Date  . ADENOIDECTOMY    . CHOLECYSTECTOMY N/A 10/04/2015   Procedure: LAPAROSCOPIC CHOLECYSTECTOMY;  Surgeon: Axel Filler, MD;  Location: [facility] OR;  Service: General;  Laterality: N/A;  . CHOLECYSTECTOMY     Family History: History reviewed. No pertinent family history.  Family Psychiatric  History: See H&P  Social History:  Social History   Substance and Sexual Activity  Alcohol Use Yes  . Alcohol/week: 3.0 standard drinks  . Types: 1 Shots of liquor, 2 Cans of beer per week   Comment: occasionally 1-2x/month     Social History   Substance and Sexual Activity  Drug Use Yes  . Types: Marijuana    Social History   Socioeconomic History  . Marital status: Legally Separated    Spouse name:  Not on file  . Number of children: Not on file  . Years of education: Not on file  . Highest education level: Not on file  Occupational History  . Not on file  Social Needs  . Financial resource strain: Not on file  . Food insecurity:    Worry: Not on file    Inability: Not on file  . Transportation needs:    Medical: Not on file    Non-medical: Not on file  Tobacco Use  . Smoking status: Never Smoker  . Smokeless tobacco: Never Used  Substance and Sexual Activity  . Alcohol use: Yes    Alcohol/week: 3.0 standard drinks    Types: 1 Shots of liquor, 2 Cans of beer per week    Comment: occasionally 1-2x/month  . Drug use: Yes    Types: Marijuana  . Sexual activity: Not Currently    Birth control/protection: None  Lifestyle  . Physical activity:    Days per week: Not on file    Minutes per session: Not on file  . Stress: Not on file  Relationships  . Social connections:    Talks on phone: Not on file    Gets together: Not on file    Attends religious service: Not on file    Active member of club or organization: Not on file    Attends meetings of clubs or organizations: Not on file    Relationship status: Not on file  Other Topics Concern  . Not on file  Social History Narrative  . Not  on file   Hospital Course: (Per Md's admission evaluation): Patient is a 38 year old male with a past psychiatric history significant for bipolar disorder who presented to the [facility] emergency department on 07/10/2018 with psychotic symptoms. The patient stated he was being admitted to the hospital because his wife and mother were "into conflict, and neither 1 of them believe me". According to the notes in the emergency department the patient has been exhibiting bizarre and psychotic behavior. He had been previously on Saphris and Trileptal, but apparently the Saphris was too expensive for him to afford and had not been on it in several months. He stated he had last  seen his nurse practitioner [facility] the [facility] "weeks and weeks" ago. The patient admitted to poor sleep, and appears to have had visual hallucinations at home. He also was found urinating in different containers and seeing snakes under his bed. He is also in interview today significantly hyper religious. He does report marijuana use, and realizes "it is against the law". His last psychiatric hospitalization at our facility was on 07/30/2017. He also had a hospitalization here on 11/15/2016. On the hospitalization in 2019 he was discharged on Lamictal, Latuda and trazodone. He denied current suicidal ideation.  Ronald Fritz was admitted to the [facility] adult unit for worsening symptoms of Bipolar I disorder, most recent episode, manic with psychosis. Chart review indicated that patient was exhibiting bizzarre & psychotic behaviors. Apparently, he has not been on his mental medications for a while due to financial circumstances & was unable to afford them. He does have a history of previous inpatient psychiatric admissions & treatments for similar complaints or presentations. His UDS on admission was positive for THC. He was not presenting with any substance withdrawal symptoms. After evaluation of his presenting symptoms, he was recommended mood stabilization treatments.  During the course of his psychiatric admission & treatments, Ronald Fritz was medicated, stabilized & was discharged on on the medication regimen as listed on the discharge medication lists below. He presented no other significant pre-existing medical problems that required treatments. He tolerated his treatment regimen without any adverse effects or reactions reported.He was enrolled & participated in the group counseling sessions being offered & held on this unit. He learned coping skills that should help him after discharge to cope better & mainatain mood stability.  And because Ronald Fritz has not been able to achieve symptoms control under an  antipsychotic monotherapy, he is currently receiving & being discharged on 2 separate antipsychotic medications, Abilify maintenna bi-weekly injectable & Risperdal tablets) which seem effective in controlling his symptoms at this time. It will benefit patient to continue on these combination antipsychotic medication therapies as recommended. However, as his symptoms continue to improve, patient may be titrated down to an antipsychotic monotherapy. This is to decrease the chances for development of metabolic syndrome & other related adverse effects associated with use of multiple antipsychotic therapies. This has to be done within the discretion & proper judgement of his outpatient psychiatric provider.  As his treatment was ongoing,  Ronald Fritz progress was monitored by observation & his daily report of symptom reduction noted. His emotional & mental status were assessed & monitored by daily self-inventory reports completed by him & the clinical staff. He was evaluated daily by the treatment team for mood stability and plans for continued recovery after discharge. He was provided with all the necessary information needed to make this appointment without problems.   Upon discharge, Ronald Fritz was both mentally & medically  stable. He adamntly denies suicidal/homicidal ideations, auditory/visual/tactile hallucinations, delusional thoughts & or paranoia. He was able to engage in safety planning including [facility] or contact emergency services if he feels unable to maintain hisown safety or the safety of others. Pt had no further questions, comments or concerns. [facility] in no apparent distress. [facility], a 7 days worth supply samples of his [facility] discharge medications. Transportation per himself - drove his car home.  Physical Findings: AIMS: Facial and Oral Movements Muscles of Facial Expression: None, normal Lips and Perioral Area: None,  normal Jaw: None, normal Tongue: None, normal,Extremity Movements Upper (arms, wrists, hands, fingers): None, normal Lower (legs, knees, ankles, toes): None, normal, Trunk Movements Neck, shoulders, hips: None, normal, Overall Severity Severity of abnormal movements (highest score from questions above): None, normal Incapacitation due to abnormal movements: None, normal Patient's awareness of abnormal movements (rate only patient's report): No Awareness, Dental Status Current problems with teeth and/or dentures?: No Does patient usually wear dentures?: No  CIWA:  CIWA-Ar Total: 4 COWS:  COWS Total Score: 1  Musculoskeletal: Strength & Muscle Tone: within normal limits Gait & Station: normal Patient leans: N/A  Psychiatric Specialty Exam: Physical Exam  Nursing note and vitals reviewed. Constitutional: He is oriented to person, place, and time. He appears well-developed.  HENT:  Head: Normocephalic.  Eyes: Pupils are equal, round, and reactive to light.  Neck: Normal range of motion.  Cardiovascular: Normal rate.  Respiratory: Effort normal.  GI: Soft.  Genitourinary:    Genitourinary Comments: Deferred   Musculoskeletal: Normal range of motion.  Neurological: He is alert and oriented to person, place, and time.  Skin: Skin is warm and dry.    Review of Systems  Constitutional: Negative.   HENT: Negative.   Eyes: Negative.   Respiratory: Negative.  Negative for cough and shortness of breath.   Cardiovascular: Negative.  Negative for chest pain and palpitations.  Gastrointestinal: Negative.  Negative for abdominal pain, heartburn, nausea and vomiting.  Genitourinary: Negative.   Musculoskeletal: Negative.   Skin: Negative.   Neurological: Negative.  Negative for dizziness and headaches.  Endo/Heme/Allergies: Negative.   Psychiatric/Behavioral: Positive for depression (Stabilized with medication prior to discharge), hallucinations (Hx. Psychosis (Stabilized with  medication prior to discharge)) and substance abuse (Hx, Cannabis use disorder). Negative for memory loss and suicidal ideas. The patient has insomnia (Stabilized with medication prior to discharge). The patient is not nervous/anxious (Stable).     Blood pressure 137/68, pulse 98, temperature (!) 97.2 F (36.2 C), temperature source Oral, resp. rate 18, height 6\' 2"  (1.88 m), weight 117.5 kg, SpO2 100 %.Body mass index is 33.25 kg/m.  See Md's discharge SRA   Have you used any form of tobacco in the last 30 days? (Cigarettes, Smokeless Tobacco, Cigars, and/or Pipes): No  Has this patient used any form of tobacco in the last 30 days? (Cigarettes, Smokeless Tobacco, Cigars, and/or Pipes): No  Blood Alcohol level:  Lab Results  Component Value Date   ETH <10 07/09/2018   ETH <10 07/04/2018   Metabolic Disorder Labs:  Lab Results  Component Value Date   HGBA1C 5.0 07/31/2017   MPG 96.8 07/31/2017   MPG 103 11/19/2016   Lab Results  Component Value Date   PROLACTIN 60.4 (H) 11/19/2016   Lab Results  Component Value Date   CHOL 149 07/31/2017   TRIG 116 07/31/2017   HDL 36 (L) 07/31/2017  CHOLHDL 4.1 07/31/2017   VLDL 23 07/31/2017   LDLCALC 90 07/31/2017   LDLCALC 52 11/19/2016   See Psychiatric Specialty Exam and Suicide Risk Assessment completed by Attending Physician prior to discharge.  Discharge destination:  Home  Is patient on multiple antipsychotic therapies at discharge:  Yes,   Do you recommend tapering to monotherapy for antipsychotics?  Yes   Has Patient had three or more failed trials of antipsychotic monotherapy by history:  Yes,   Antipsychotic medications that previously failed include:   1.  Latuda., 2.  Olanzapine. and 3.  Risperdal.  Recommended Plan for Multiple Antipsychotic Therapies: Taper to monotherapy as described:  Per the outpatient psychiatric provider, when his symptoms stabilize.  Allergies as of 07/15/2018   No Known Allergies      Medication List    STOP taking these medications   asenapine 5 MG Subl 24 hr tablet Commonly known as:  SAPHRIS   Oxcarbazepine 300 MG tablet Commonly known as:  TRILEPTAL     TAKE these medications     Indication  ARIPiprazole ER 400 MG Srer injection Commonly known as:  ABILIFY MAINTENA Inject 2 mLs (400 mg total) into the muscle every 28 (twenty-eight) days. (Due on 08-12-18): For mood control Start taking on:  August 12, 2018  Indication:  Mood control   carbamazepine 200 MG tablet Commonly known as:  TEGRETOL Take 1 tablet (200 mg total) by mouth 3 (three) times daily. For Mood stabilization  Indication:  Mood stabilization   divalproex 500 MG 24 hr tablet Commonly known as:  DEPAKOTE ER Take 1 tablet (500 mg total) by mouth 2 (two) times daily. For mood stabilization  Indication:  Mood stabilization   hydrOXYzine 25 MG tablet Commonly known as:  ATARAX/VISTARIL Take 1 tablet (25 mg total) by mouth 3 (three) times daily as needed for anxiety.  Indication:  Feeling Anxious   risperidone 4 MG tablet Commonly known as:  RISPERDAL Take 1 tablet (4 mg total) by mouth at bedtime. For mood control  Indication:  Mood control   risperiDONE 1 MG tablet Commonly known as:  RISPERDAL Take 1 tablet (1 mg total) by mouth daily. (Morning): For mood control (Morning): Start taking on:  July 16, 2018  Indication:  Mood control   traZODone 100 MG tablet Commonly known as:  DESYREL Take 1 tablet (100 mg total) by mouth at bedtime as needed for sleep.  Indication:  Trouble Sleeping      Follow-up Information    [facility] up on 07/23/2018.   Why:  Your next appointment with therapist Tresa EndoKelly is Thursday, 2/27 at 3:00p.  Contact information: [address] [place] [address] [phone]        [facility] 07[facility]/2018.   Why:  Your next appointment with Nurse Practitioner Tresa EndoKelly is Tuesday, 2/25 at 2:30p. Please bring your  current medications and discharge paperwork from this hospitalization.  Contact information: [address]  [facility] [address] P: [phone] F: [phone]         Follow-up recommendations: Activity:  As tolerated Diet: As recommended by your primary care doctor. Keep all scheduled follow-up appointments as recommended.   Comments: Patient is instructed prior to discharge to: Take all medications as prescribed by his/her mental healthcare provider. Report any adverse effects and or reactions from the medicines to his/her outpatient provider promptly. Patient has been instructed & cautioned: To not engage in alcohol and  or illegal drug use while on prescription medicines. In the event of worsening symptoms, patient is instructed to call the crisis hotline, 911 and or go to the nearest ED for appropriate evaluation and treatment of symptoms. To follow-up with his/her primary care provider for your other medical issues, concerns and or health care needs.   Signed: Armandina Stammer, NP, PMHNP, FNP-BC 07/15/2018, 2:18 PM

## 2018-07-15 NOTE — Plan of Care (Signed)
Pt did not attend recreation therapy group sessions.   Tobie Perdue, LRT/CTRS 

## 2018-07-15 NOTE — Progress Notes (Signed)
Recreation Therapy Notes  INPATIENT RECREATION TR PLAN  Patient Details Name: Ronald Fritz MRN: [id number] DOB: November 29, 1980 Today's Date: 07/15/2018  Rec Therapy Plan Is patient appropriate for Therapeutic Recreation?: Yes Treatment times per week: about 3 days Estimated Length of Stay: 5-7 days TR Treatment/Interventions: Group participation (Comment)  Discharge Criteria Pt will be discharged from therapy if:: Discharged Treatment plan/goals/alternatives discussed and agreed upon by:: Patient/family  Discharge Summary Short term goals set: See patient care plan Short term goals met: Not met Reason goals not met: Pt did not attend group sessions. Therapeutic equipment acquired: N/A Reason patient discharged from therapy: Discharge from hospital Pt/family agrees with progress & goals achieved: Yes Date patient discharged from therapy: 07/15/18     Victorino Sparrow, LRT/CTRS  Ria Comment, Xavier Fournier A 07/15/2018, 11:49 AM

## 2018-07-15 NOTE — [facility] Suicide Risk Assessment (Signed)
[facility] INPATIENT:  Family/Significant Other Suicide Prevention Education  Suicide Prevention Education:  Education Completed; w mother Markice Halliwell [phone], has been identified by the patient as the family member/significant other with whom the patient will be residing, and identified as the person(s) who will aid the patient in the event of a mental health crisis (suicidal ideations/suicide attempt).  With written consent from the patient, the family member/significant other has been provided the following suicide prevention education, prior to the and/or following the discharge of the patient.  The suicide prevention education provided includes the following:  Suicide risk factors  Suicide prevention and interventions  National Suicide Hotline telephone number  [facility] assessment telephone number  [facility] Emergency Assistance 911  [facility] and/or Residential Mobile Crisis Unit telephone number  Request made of family/significant other to:  Remove weapons (e.g., guns, rifles, knives), all items previously/currently identified as safety concern. Ms. Sauder verified that there are no guns in her home and that Kurt has no access to guns.  Remove drugs/medications (over-the-counter, prescriptions, illicit drugs), all items previously/currently identified as a safety concern.  The family member/significant other verbalizes understanding of the suicide prevention education information provided.  The family member/significant other agrees to remove the items of safety concern listed above.  Marian Sorrow, MSW Intern CSW Department 07/15/2018, 11:43 AM

## 2018-07-15 NOTE — [facility] Suicide Risk Assessment (Signed)
[facility] Discharge Suicide Risk Assessment   Principal Problem: Bipolar manic with psychosis Discharge Diagnoses: Active Problems:   Severe manic bipolar 1 disorder with psychotic behavior (HCC)   Total Time spent with patient: 45 minutes  Current mental status exam-alert and oriented to person place time and situation affect continues to be somewhat stiff and flat but this seems to be baseline now no acute auditory visual loose [place] no paranoia no thoughts of harming self or others understands that he cannot go towards his wife due to the restraining order while staying at his hotel or his mother's house Mental Status Per Nursing Assessment::   On Admission:  NA  Demographic Factors:  Male and Divorced or widowed  Loss Factors: Legal issues  Historical Factors: Impulsivity  Risk Reduction Factors:   Sense of responsibility to family and Religious beliefs about death  Continued Clinical Symptoms:  Bipolar Disorder:   Depressive phase  Cognitive Features That Contribute To Risk:  Loss of executive function    Suicide Risk:  Minimal: No identifiable suicidal ideation.  Patients presenting with no risk factors but with morbid ruminations; may be classified as minimal risk based on the severity of the depressive symptoms  Follow-up Information    [facility] Follow up on 07/23/2018.   Why:  Your next appointment with therapist Tresa Endo is Thursday, 2/27 at 3:00p.  Contact information: [address] [place] [address] [phone]        [facility] Follow up on 07/21/2018.   Why:  Your next appointment with Nurse Practitioner Tresa Endo is Tuesday, 2/25 at 2:30p. Please bring your current medications and discharge paperwork from this hospitalization.  Contact information: [address]  [place] [address] P: [phone] F: [phone]          Plan Of Care/Follow-up recommendations:  Activity:  full  Benisha Hadaway, MD 07/15/2018, 10:07  AM

## 2018-07-15 NOTE — [facility] Suicide Risk Assessment (Signed)
[facility] INPATIENT:  Family/Significant Other Suicide Prevention Education  Suicide Prevention Education:  Contact Attempts: w mother Avyan Pirri [phone] has been identified by the patient as the family member/significant other with whom the patient will be residing, and identified as the person(s) who will aid the patient in the event of a mental health crisis.  With written consent from the patient, two attempts were made to provide suicide prevention education, prior to and/or following the patient's discharge.  We were unsuccessful in providing suicide prevention education.  A suicide education pamphlet was given to the patient to share with family/significant other.  Date and time of first attempt:  07/15/2018  9:29A   Marian Sorrow, MSW Intern CSW Department 07/15/2018, 9:32 AM

## 2018-07-15 NOTE — Progress Notes (Signed)
°  [facility] Adult Case Management Discharge Plan :  Will you be returning to the same living situation after discharge:  Yes,  His hotel at [facility] [place] At discharge, do you have transportation home?: Yes,  His car Do you have the ability to pay for your medications: Yes,  [employer]  Release of information consent forms completed and in the chart;  Patient's signature needed at discharge.  Patient to Follow up at: Follow-up Information    [facility] Follow up on 07/23/2018.   Why:  Your next appointment with therapist Tresa Endo is Thursday, 2/27 at 3:00p.  Contact information: [address] [place] [address] [phone]        [facility] Follow up on 07/21/2018.   Why:  Your next appointment with Nurse Practitioner Tresa Endo is Tuesday, 2/25 at 2:30p. Please bring your current medications and discharge paperwork from this hospitalization.  Contact information: [address]  [place] [address] P: [phone] F: [phone]          Next level of care provider has access to [facility] Link:no  Safety Planning and Suicide Prevention discussed: Yes,  w mother Demari Sins [phone]  Have you used any form of tobacco in the last 30 days? (Cigarettes, Smokeless Tobacco, Cigars, and/or Pipes): No  Has patient been referred to the Quitline?: N/A patient is not a smoker  Patient has been referred for addiction treatment: N/A  Marian Sorrow, MSW Intern CSW Department 07/15/2018, 11:49 AM

## 2018-07-15 NOTE — Progress Notes (Signed)
Recreation Therapy Notes  Date: 2.19. 20 Time: 1000 Location: [address] Dayroom  Group Topic:  Goal Planning  Goal Area(s) Addresses:  Patient will be able to identify at least 3 goals for immediate future.  Patient will be able to identify benefit of investing in goals.  Patient will be able to identify benefit of setting goals.   Intervention: Worksheet, pencils  Activity: Goal Planning.  Patients were to set goals they hope to accomplish within the next week, month, year and five years.  Patients were to then identify any obstacles that would prevent them from reaching their goals, identify what they need to reach their goals and what they can start doing now to work towards their goals.  Education:  Discharge Planning, Coping Skills, Leisure Education   Education Outcome: Acknowledges Education/In Group Clarification Provided/Needs Additional Education  Clinical Observations:  Pt did not attend group.    Genie Mirabal, LRT/CTRS         Conlee Sliter A 07/15/2018 11:37 AM 

## 2018-07-15 NOTE — Progress Notes (Signed)
Patient ID: Ronald Fritz, male   DOB: 27-Mar-1981, 38 y.o.   MRN: [id number]  D: Pt alert and oriented on the unit.   A: Education, support, and encouragement provided. Discharge summary, medications and follow up appointments reviewed with pt. Suicide prevention resources provided, including "My 3 App." Pt's belongings in locker # 31 returned and belongings sheet signed.  R: Pt denies SI/HI, A/VH, pain, or any concerns at this time. Pt ambulatory on and off unit. Pt discharged to lobby.
